# Patient Record
Sex: Female | Born: 1975 | Race: Black or African American | Hispanic: No | Marital: Married | Smoking: Current some day smoker
Health system: Southern US, Community
[De-identification: ages and names within clinical notes are randomized; demographics above are authoritative.]

## PROBLEM LIST (undated history)

## (undated) DIAGNOSIS — M549 Dorsalgia, unspecified: Secondary | ICD-10-CM

## (undated) DIAGNOSIS — M797 Fibromyalgia: Secondary | ICD-10-CM

## (undated) DIAGNOSIS — I10 Essential (primary) hypertension: Secondary | ICD-10-CM

## (undated) DIAGNOSIS — G8929 Other chronic pain: Secondary | ICD-10-CM

## (undated) DIAGNOSIS — G43909 Migraine, unspecified, not intractable, without status migrainosus: Secondary | ICD-10-CM

## (undated) HISTORY — PX: BREAST LUMPECTOMY: SHX2

## (undated) HISTORY — PX: CRYOABLATION: SHX1415

## (undated) HISTORY — PX: TUBAL LIGATION: SHX77

---

## 2009-06-16 ENCOUNTER — Emergency Department (HOSPITAL_COMMUNITY): Admission: EM | Admit: 2009-06-16 | Discharge: 2009-06-16 | Payer: Self-pay | Admitting: Emergency Medicine

## 2010-01-27 ENCOUNTER — Emergency Department (HOSPITAL_COMMUNITY): Admission: EM | Admit: 2010-01-27 | Discharge: 2010-01-27 | Payer: Self-pay | Admitting: Emergency Medicine

## 2010-05-01 ENCOUNTER — Emergency Department (HOSPITAL_COMMUNITY): Admission: EM | Admit: 2010-05-01 | Discharge: 2010-05-01 | Payer: Self-pay | Admitting: Emergency Medicine

## 2010-11-05 ENCOUNTER — Encounter: Payer: Self-pay | Admitting: Rheumatology

## 2010-12-03 ENCOUNTER — Encounter: Payer: Self-pay | Admitting: Rheumatology

## 2010-12-25 ENCOUNTER — Other Ambulatory Visit (HOSPITAL_COMMUNITY): Payer: Self-pay | Admitting: Family Medicine

## 2010-12-25 DIAGNOSIS — R29898 Other symptoms and signs involving the musculoskeletal system: Secondary | ICD-10-CM

## 2010-12-25 DIAGNOSIS — M545 Low back pain: Secondary | ICD-10-CM

## 2010-12-29 ENCOUNTER — Ambulatory Visit (HOSPITAL_COMMUNITY): Admission: RE | Admit: 2010-12-29 | Payer: Medicaid Other | Source: Ambulatory Visit

## 2010-12-30 ENCOUNTER — Ambulatory Visit (HOSPITAL_COMMUNITY)
Admission: RE | Admit: 2010-12-30 | Discharge: 2010-12-30 | Disposition: A | Discharge status: Home / Self Care | Payer: Medicaid Other | Source: Ambulatory Visit | Attending: Family Medicine | Admitting: Family Medicine

## 2010-12-30 DIAGNOSIS — M545 Low back pain: Secondary | ICD-10-CM

## 2010-12-30 DIAGNOSIS — R29898 Other symptoms and signs involving the musculoskeletal system: Secondary | ICD-10-CM

## 2010-12-30 DIAGNOSIS — M5380 Other specified dorsopathies, site unspecified: Secondary | ICD-10-CM | POA: Insufficient documentation

## 2011-01-18 LAB — URINALYSIS, ROUTINE W REFLEX MICROSCOPIC
Glucose, UA: NEGATIVE mg/dL
Leukocytes, UA: NEGATIVE
Nitrite: NEGATIVE
Specific Gravity, Urine: 1.03 (ref 1.005–1.030)
Urobilinogen, UA: 0.2 mg/dL (ref 0.0–1.0)
pH: 5.5 (ref 5.0–8.0)

## 2011-01-18 LAB — DIFFERENTIAL
Basophils Absolute: 0.1 10*3/uL (ref 0.0–0.1)
Basophils Relative: 0 % (ref 0–1)
Lymphocytes Relative: 19 % (ref 12–46)
Lymphs Abs: 3.4 10*3/uL (ref 0.7–4.0)
Monocytes Absolute: 0.9 10*3/uL (ref 0.1–1.0)
Neutro Abs: 12.8 10*3/uL — ABNORMAL HIGH (ref 1.7–7.7)
Neutrophils Relative %: 73 % (ref 43–77)

## 2011-01-18 LAB — COMPREHENSIVE METABOLIC PANEL
AST: 17 U/L (ref 0–37)
CO2: 25 mEq/L (ref 19–32)
Calcium: 9 mg/dL (ref 8.4–10.5)
Creatinine, Ser: 0.78 mg/dL (ref 0.4–1.2)
GFR calc Af Amer: >60 mL/min (ref >60)
Sodium: 140 mEq/L (ref 135–145)

## 2011-01-18 LAB — CBC
Hemoglobin: 13.1 g/dL (ref 12.0–15.0)
MCH: 24 pg — ABNORMAL LOW (ref 26.0–34.0)
MCHC: 32.3 g/dL (ref 30.0–36.0)
MCV: 74.4 fL — ABNORMAL LOW (ref 78.0–100.0)
Platelets: 310 10*3/uL (ref 150–400)
RBC: 5.46 MIL/uL — ABNORMAL HIGH (ref 3.87–5.11)

## 2011-01-18 LAB — POCT PREGNANCY, URINE: Preg Test, Ur: NEGATIVE

## 2011-01-26 LAB — PREGNANCY, URINE: Preg Test, Ur: NEGATIVE

## 2011-01-26 LAB — DIFFERENTIAL
Basophils Absolute: 0 10*3/uL (ref 0.0–0.1)
Eosinophils Relative: 1 % (ref 0–5)
Lymphocytes Relative: 10 % — ABNORMAL LOW (ref 12–46)
Lymphs Abs: 2 10*3/uL (ref 0.7–4.0)
Monocytes Absolute: 1.5 10*3/uL — ABNORMAL HIGH (ref 0.1–1.0)
Monocytes Relative: 7 % (ref 3–12)
Neutrophils Relative %: 82 % — ABNORMAL HIGH (ref 43–77)

## 2011-01-26 LAB — WET PREP, GENITAL: Yeast Wet Prep HPF POC: NONE SEEN

## 2011-01-26 LAB — CBC
HCT: 39.6 % (ref 36.0–46.0)
Hemoglobin: 12.9 g/dL (ref 12.0–15.0)
MCHC: 32.6 g/dL (ref 30.0–36.0)
MCV: 73.3 fL — ABNORMAL LOW (ref 78.0–100.0)
RDW: 15.7 % — ABNORMAL HIGH (ref 11.5–15.5)
WBC: 21.3 10*3/uL — ABNORMAL HIGH (ref 4.0–10.5)

## 2011-01-26 LAB — BASIC METABOLIC PANEL
BUN: 5 mg/dL — ABNORMAL LOW (ref 6–23)
Calcium: 8.6 mg/dL (ref 8.4–10.5)
GFR calc Af Amer: >60 mL/min (ref >60)
GFR calc non Af Amer: >60 mL/min (ref >60)

## 2011-01-26 LAB — URINALYSIS, ROUTINE W REFLEX MICROSCOPIC
Glucose, UA: NEGATIVE mg/dL
Protein, ur: 30 mg/dL — AB
Specific Gravity, Urine: 1.03 (ref 1.005–1.030)
pH: 5.5 (ref 5.0–8.0)

## 2011-01-26 LAB — URINE MICROSCOPIC-ADD ON

## 2011-02-08 LAB — URINALYSIS, ROUTINE W REFLEX MICROSCOPIC
Hgb urine dipstick: NEGATIVE
Nitrite: NEGATIVE
Specific Gravity, Urine: 1.029 (ref 1.005–1.030)
Urobilinogen, UA: >8 mg/dL — ABNORMAL HIGH (ref 0.0–1.0)
pH: 6 (ref 5.0–8.0)

## 2011-02-08 LAB — URINE CULTURE: Colony Count: 60000

## 2011-02-08 LAB — GC/CHLAMYDIA PROBE AMP, GENITAL
Chlamydia, DNA Probe: NEGATIVE
GC Probe Amp, Genital: NEGATIVE

## 2011-02-08 LAB — POCT PREGNANCY, URINE: Preg Test, Ur: NEGATIVE

## 2011-02-08 LAB — URINE MICROSCOPIC-ADD ON

## 2011-04-29 ENCOUNTER — Emergency Department (HOSPITAL_COMMUNITY)
Admission: EM | Admit: 2011-04-29 | Discharge: 2011-04-29 | Disposition: A | Discharge status: Home / Self Care | Payer: Medicaid Other | Attending: Emergency Medicine | Admitting: Emergency Medicine

## 2011-04-29 DIAGNOSIS — IMO0001 Reserved for inherently not codable concepts without codable children: Secondary | ICD-10-CM | POA: Insufficient documentation

## 2011-04-29 DIAGNOSIS — R112 Nausea with vomiting, unspecified: Secondary | ICD-10-CM | POA: Insufficient documentation

## 2011-04-29 DIAGNOSIS — M549 Dorsalgia, unspecified: Secondary | ICD-10-CM | POA: Insufficient documentation

## 2011-04-29 DIAGNOSIS — M1990 Unspecified osteoarthritis, unspecified site: Secondary | ICD-10-CM | POA: Insufficient documentation

## 2011-04-29 DIAGNOSIS — J45909 Unspecified asthma, uncomplicated: Secondary | ICD-10-CM | POA: Insufficient documentation

## 2011-04-29 DIAGNOSIS — R51 Headache: Secondary | ICD-10-CM | POA: Insufficient documentation

## 2011-04-29 LAB — URINALYSIS, ROUTINE W REFLEX MICROSCOPIC
Leukocytes, UA: NEGATIVE
Nitrite: NEGATIVE
Protein, ur: NEGATIVE mg/dL
Specific Gravity, Urine: 1.015 (ref 1.005–1.030)
Urobilinogen, UA: 0.2 mg/dL (ref 0.0–1.0)

## 2011-04-29 LAB — URINE MICROSCOPIC-ADD ON

## 2011-05-15 ENCOUNTER — Emergency Department (HOSPITAL_COMMUNITY): Payer: Medicaid Other

## 2011-05-15 ENCOUNTER — Emergency Department (HOSPITAL_COMMUNITY)
Admission: EM | Admit: 2011-05-15 | Discharge: 2011-05-15 | Disposition: A | Discharge status: Home / Self Care | Payer: Medicaid Other | Attending: Emergency Medicine | Admitting: Emergency Medicine

## 2011-05-15 ENCOUNTER — Encounter: Payer: Self-pay | Admitting: Emergency Medicine

## 2011-05-15 ENCOUNTER — Other Ambulatory Visit: Payer: Self-pay

## 2011-05-15 DIAGNOSIS — G43909 Migraine, unspecified, not intractable, without status migrainosus: Secondary | ICD-10-CM | POA: Insufficient documentation

## 2011-05-15 DIAGNOSIS — R1011 Right upper quadrant pain: Secondary | ICD-10-CM | POA: Insufficient documentation

## 2011-05-15 DIAGNOSIS — IMO0001 Reserved for inherently not codable concepts without codable children: Secondary | ICD-10-CM | POA: Insufficient documentation

## 2011-05-15 DIAGNOSIS — D72829 Elevated white blood cell count, unspecified: Secondary | ICD-10-CM | POA: Insufficient documentation

## 2011-05-15 DIAGNOSIS — F17200 Nicotine dependence, unspecified, uncomplicated: Secondary | ICD-10-CM | POA: Insufficient documentation

## 2011-05-15 HISTORY — DX: Fibromyalgia: M79.7

## 2011-05-15 HISTORY — DX: Dorsalgia, unspecified: M54.9

## 2011-05-15 HISTORY — DX: Migraine, unspecified, not intractable, without status migrainosus: G43.909

## 2011-05-15 HISTORY — DX: Other chronic pain: G89.29

## 2011-05-15 LAB — URINALYSIS, ROUTINE W REFLEX MICROSCOPIC
Bilirubin Urine: NEGATIVE
Glucose, UA: NEGATIVE mg/dL
Ketones, ur: NEGATIVE mg/dL
Leukocytes, UA: NEGATIVE
Nitrite: NEGATIVE
Specific Gravity, Urine: 1.015 (ref 1.005–1.030)
pH: 8.5 — ABNORMAL HIGH (ref 5.0–8.0)

## 2011-05-15 LAB — COMPREHENSIVE METABOLIC PANEL
ALT: 23 U/L (ref 0–35)
Albumin: 3.3 g/dL — ABNORMAL LOW (ref 3.5–5.2)
Alkaline Phosphatase: 134 U/L — ABNORMAL HIGH (ref 39–117)
Calcium: 9.6 mg/dL (ref 8.4–10.5)
GFR calc Af Amer: >60 mL/min (ref >60)
Potassium: 4 mEq/L (ref 3.5–5.1)
Sodium: 136 mEq/L (ref 135–145)
Total Protein: 8.1 g/dL (ref 6.0–8.3)

## 2011-05-15 LAB — CBC
MCH: 23.4 pg — ABNORMAL LOW (ref 26.0–34.0)
MCHC: 32.5 g/dL (ref 30.0–36.0)
Platelets: 475 10*3/uL — ABNORMAL HIGH (ref 150–400)
RDW: 15.2 % (ref 11.5–15.5)

## 2011-05-15 MED ORDER — SODIUM CHLORIDE 0.9 % IV SOLN
Freq: Once | INTRAVENOUS | Status: AC
  Administered 2011-05-15: 17:00:00 via INTRAVENOUS

## 2011-05-15 MED ORDER — IOHEXOL 300 MG/ML  SOLN
100.0000 mL | Freq: Once | INTRAMUSCULAR | Status: AC | PRN
  Administered 2011-05-15: 100 mL via INTRAVENOUS

## 2011-05-15 MED ORDER — ONDANSETRON HCL 4 MG/2ML IJ SOLN
4.0000 mg | Freq: Once | INTRAMUSCULAR | Status: AC
  Administered 2011-05-15: 4 mg via INTRAVENOUS
  Filled 2011-05-15: qty 2

## 2011-05-15 MED ORDER — MORPHINE SULFATE 4 MG/ML IJ SOLN
4.0000 mg | Freq: Once | INTRAMUSCULAR | Status: AC
  Administered 2011-05-15: 4 mg via INTRAVENOUS
  Filled 2011-05-15: qty 1

## 2011-05-15 MED ORDER — OXYCODONE-ACETAMINOPHEN 5-325 MG PO TABS: 2.0000 | ORAL_TABLET | ORAL | Status: AC | PRN

## 2011-05-15 MED ORDER — OXYCODONE-ACETAMINOPHEN 5-325 MG PO TABS
1.0000 | ORAL_TABLET | Freq: Once | ORAL | Status: AC
  Administered 2011-05-15: 1 via ORAL
  Filled 2011-05-15: qty 1

## 2011-05-15 NOTE — ED Notes (Signed)
Pt back from ct

## 2011-05-15 NOTE — ED Notes (Signed)
Pt c/o sharp, shooting pain that started to r side of chest that radiates down r side and in r leg posterior. Pt tearful. Deep breathing and any movement makes the pain worse. "a little" sob.

## 2011-05-15 NOTE — ED Provider Notes (Signed)
History     Chief Complaint  Patient presents with  . Chest Pain   HPI Comments: She woke up with right sided pain radiating from her right upper chest to her right leg.   She says the pain comes and goes and lasts a few minutes.  It is easing off now.  She denies trauma, fever, chills, n/v/sob/uti sxs.  She denies cough.  The pain does not change with movement or food.  She denies similar sxs in past.  She has had a BTL.  She denies recent surgery or travel.  She smokes.    The history is provided by the patient.    Past Medical History  Diagnosis Date  . Fibromyalgia   . Chronic back pain   . Migraines     Past Surgical History  Procedure Date  . Tubal ligation   . Breast lumpectomy     History reviewed. No pertinent family history.  History  Substance Use Topics  . Smoking status: Current Everyday Smoker    Types: Cigarettes  . Smokeless tobacco: Not on file  . Alcohol Use: No    OB History    Grav Para Term Preterm Abortions TAB SAB Ect Mult Living                  Review of Systems  Constitutional: Negative for fever, chills, diaphoresis and appetite change.  HENT: Negative for facial swelling and neck pain.   Eyes: Negative for redness.  Respiratory: Negative for cough, chest tightness and shortness of breath.   Cardiovascular: Positive for chest pain. Negative for palpitations.  Genitourinary: Positive for flank pain. Negative for dysuria, urgency, frequency, hematuria, vaginal bleeding, vaginal discharge, difficulty urinating and pelvic pain.  Musculoskeletal: Negative for back pain.  Neurological: Negative for headaches.  Hematological: Does not bruise/bleed easily.  Psychiatric/Behavioral: Negative for confusion.    Physical Exam  BP 148/99  Pulse 109  Temp(Src) 98.4 F (36.9 C) (Oral)  SpO2 100%  LMP 05/14/2010  Physical Exam  Constitutional: She is oriented to person, place, and time. She appears well-developed and well-nourished.  HENT:    Head: Normocephalic.  Eyes: Pupils are equal, round, and reactive to light.  Neck: Normal range of motion. Neck supple. No thyromegaly present.  Cardiovascular: Tachycardia present.   No murmur heard. Pulmonary/Chest: No respiratory distress. She has no wheezes. She has no rales.  Abdominal: She exhibits no ascites and no mass. There is tenderness in the right upper quadrant. There is no rigidity, no rebound, no guarding, no tenderness at McBurney's point and negative Murphy's sign.  Musculoskeletal: Normal range of motion. She exhibits no edema and no tenderness.  Neurological: She is alert and oriented to person, place, and time.  Skin: Skin is warm and dry.  Psychiatric: She has a normal mood and affect. Her behavior is normal.    ED Course  Procedures  ED ECG REPORT   Date: 05/15/2011  EKG Time: 15:45  Rate: 114  Rhythm: sinus tachycardia,  sinus tachycardia  Axis: normal  Intervals:none  ST&T Change: normal  Narrative Interpretation: sinus tach, o/w normal           MDM   Pain improved. Explained test results and plan for discharge. Pt and husband agree with plan.          Nicholes Stairs, MD 05/15/11 2049

## 2011-07-14 ENCOUNTER — Other Ambulatory Visit (HOSPITAL_COMMUNITY): Payer: Self-pay | Admitting: Family Medicine

## 2011-07-14 DIAGNOSIS — R51 Headache: Secondary | ICD-10-CM

## 2011-07-16 ENCOUNTER — Ambulatory Visit (HOSPITAL_COMMUNITY): Payer: Medicaid Other | Attending: Family Medicine

## 2011-07-23 ENCOUNTER — Ambulatory Visit (HOSPITAL_COMMUNITY)
Admission: RE | Admit: 2011-07-23 | Discharge: 2011-07-23 | Disposition: A | Discharge status: Home / Self Care | Payer: Medicaid Other | Source: Ambulatory Visit | Attending: Family Medicine | Admitting: Family Medicine

## 2011-07-23 DIAGNOSIS — R51 Headache: Secondary | ICD-10-CM | POA: Insufficient documentation

## 2011-08-03 ENCOUNTER — Other Ambulatory Visit: Payer: Self-pay

## 2011-08-03 ENCOUNTER — Emergency Department (HOSPITAL_COMMUNITY)
Admission: EM | Admit: 2011-08-03 | Discharge: 2011-08-04 | Disposition: A | Discharge status: Home / Self Care | Payer: Medicaid Other | Attending: Emergency Medicine | Admitting: Emergency Medicine

## 2011-08-03 DIAGNOSIS — J45909 Unspecified asthma, uncomplicated: Secondary | ICD-10-CM | POA: Insufficient documentation

## 2011-08-03 DIAGNOSIS — I10 Essential (primary) hypertension: Secondary | ICD-10-CM | POA: Insufficient documentation

## 2011-08-03 DIAGNOSIS — IMO0001 Reserved for inherently not codable concepts without codable children: Secondary | ICD-10-CM | POA: Insufficient documentation

## 2011-08-03 DIAGNOSIS — G43909 Migraine, unspecified, not intractable, without status migrainosus: Secondary | ICD-10-CM | POA: Insufficient documentation

## 2011-08-03 DIAGNOSIS — I498 Other specified cardiac arrhythmias: Secondary | ICD-10-CM | POA: Insufficient documentation

## 2011-08-03 DIAGNOSIS — F17200 Nicotine dependence, unspecified, uncomplicated: Secondary | ICD-10-CM | POA: Insufficient documentation

## 2011-08-03 DIAGNOSIS — R0789 Other chest pain: Secondary | ICD-10-CM

## 2011-08-03 DIAGNOSIS — R071 Chest pain on breathing: Secondary | ICD-10-CM | POA: Insufficient documentation

## 2011-08-03 HISTORY — DX: Essential (primary) hypertension: I10

## 2011-08-03 NOTE — ED Notes (Signed)
Left side cp that started this am, sob at times, denies any n/v, also migraine for several weeks

## 2011-08-04 ENCOUNTER — Encounter: Payer: Self-pay | Admitting: Emergency Medicine

## 2011-08-04 MED ORDER — ONDANSETRON HCL 4 MG PO TABS
4.0000 mg | ORAL_TABLET | Freq: Once | ORAL | Status: AC
  Administered 2011-08-04: 4 mg via ORAL
  Filled 2011-08-04: qty 1

## 2011-08-04 MED ORDER — DIPHENHYDRAMINE HCL 50 MG/ML IJ SOLN
25.0000 mg | Freq: Once | INTRAMUSCULAR | Status: AC
  Administered 2011-08-04: 25 mg via INTRAMUSCULAR
  Filled 2011-08-04: qty 1

## 2011-08-04 MED ORDER — HYDROMORPHONE HCL 1 MG/ML IJ SOLN
1.0000 mg | Freq: Once | INTRAMUSCULAR | Status: AC
  Administered 2011-08-04: 1 mg via INTRAMUSCULAR
  Filled 2011-08-04: qty 1

## 2011-08-04 MED ORDER — METOCLOPRAMIDE HCL 5 MG/ML IJ SOLN
10.0000 mg | Freq: Once | INTRAMUSCULAR | Status: AC
  Administered 2011-08-04: 10 mg via INTRAMUSCULAR
  Filled 2011-08-04: qty 2

## 2011-08-04 NOTE — ED Provider Notes (Signed)
History     CSN: [id number] Arrival date & time: 08/03/2011 10:08 PM  Chief Complaint  Patient presents with  . Chest Pain    startd today  . Migraine    for weeks    (Consider location/radiation/quality/duration/timing/severity/associated sxs/prior treatment) HPI Comments: Seen 2307.Patient here with chest pain that started today that she thinks was brought on by a headache she has had for two months. Chest pain was on the left side, sharp and stabbing, worse with deep breathing. It has been intermittent since it began this morning.She has had a headache for 2 months that she has been unable to get rid of. Headache occurs daily. Sometimes she wakes in the morning with the headache. She has not been evaluated by neurology. She has seen her PCP who gave her a Rx for a medication she has yet to get filled.At the present time her headache is worse than her chest pain. Headache is not associated with vision changes,hearing changes, stiff neck, siddiculty swallowing or speaking.   Patient is a 35 y.o. female presenting with chest pain. The history is provided by the patient.  Chest Pain The chest pain began 6 - 12 hours ago. Chest pain occurs frequently. The chest pain is unchanged. At its most intense, the pain is at 7/10. The pain is currently at 3/10. The severity of the pain is moderate. The quality of the pain is described as sharp and stabbing. The pain does not radiate. Chest pain is worsened by deep breathing. Pertinent negatives for primary symptoms include no fever, no cough, no wheezing, no palpitations, no nausea, no vomiting and no dizziness.  Pertinent negatives for associated symptoms include no numbness and no weakness. She tried nothing for the symptoms. Risk factors include no known risk factors.     Past Medical History  Diagnosis Date  . Fibromyalgia   . Hypertension   . Asthma     Past Surgical History  Procedure Date  . Tubal ligation   . Cryoablation     History  reviewed. No pertinent family history.  History  Substance Use Topics  . Smoking status: Current Everyday Smoker    Types: Cigarettes  . Smokeless tobacco: Not on file  . Alcohol Use: No    OB History    Grav Para Term Preterm Abortions TAB SAB Ect Mult Living                  Review of Systems  Constitutional: Negative for fever.  Respiratory: Negative for cough and wheezing.   Cardiovascular: Positive for chest pain. Negative for palpitations and leg swelling.  Gastrointestinal: Negative for nausea and vomiting.  Neurological: Positive for headaches. Negative for dizziness, speech difficulty, weakness and numbness.  All other systems reviewed and are negative.    Allergies  Aspirin and Ceclor  Home Medications   Current Outpatient Rx  Name Route Sig Dispense Refill  . DICLOFENAC SODIUM 75 MG PO TBEC Oral Take 75 mg by mouth 2 (two) times daily.      Marland Kitchen GABAPENTIN PO Oral Take 1 capsule by mouth 2 (two) times daily.      Marland Kitchen FLUOXETINE HCL 20 MG PO CAPS Oral Take 20 mg by mouth daily.      Marland Kitchen TIZANIDINE HCL 4 MG PO TABS Oral Take 4 mg by mouth 2 (two) times daily.        BP 132/81  Pulse 96  Temp(Src) 98.4 F (36.9 C) (Oral)  Resp 16  Ht 5' 3.5" (  1.613 m)  Wt 194 lb (87.998 kg)  BMI 33.83 kg/m2  SpO2 99%  LMP 08/03/2011  Physical Exam  Nursing note and vitals reviewed. Constitutional: She is oriented to person, place, and time. She appears well-developed and well-nourished.       Anxious, hyperventilating, tearful  HENT:  Head: Normocephalic and atraumatic.  Right Ear: External ear normal.  Left Ear: External ear normal.  Nose: Nose normal.  Mouth/Throat: Oropharynx is clear and moist.  Eyes: Conjunctivae and EOM are normal. Pupils are equal, round, and reactive to light.  Neck: Normal range of motion. Neck supple.  Cardiovascular: Normal rate, normal heart sounds and intact distal pulses.   Pulmonary/Chest: Breath sounds normal. No respiratory distress.  She has no wheezes. She has no rales. She exhibits no tenderness.  Abdominal: Soft. Bowel sounds are normal.  Musculoskeletal: Normal range of motion.  Neurological: She is alert and oriented to person, place, and time. She has normal reflexes. No cranial nerve deficit. Coordination normal.  Skin: Skin is warm and dry.  Psychiatric:       Anxious, tearful    ED Course  Procedures (including critical care time)  Labs Reviewed - No data to display No results found.   1. Migraine   2. Chest wall pain     Date: 08/04/2011 2202  Rate: 105  Rhythm: sinus tachycardia  QRS Axis: normal  Intervals: normal  ST/T Wave abnormalities: normal  Conduction Disutrbances:none  Narrative Interpretation:   Old EKG Reviewed: none available    MDM  Patient with c/o sharp stabbing chest pain and a longstanding headache. EKG was normal. Focus seemed to be on headache. Patient anxious and tearful on arrival. Given analgesic, antiemetic and benadryl with headache relief. Patient took PO fluids. Ambulated to the bathroom without assistance.Pt feels improved after observation and/or treatment in ED.Pt stable in ED with no significant deterioration in condition.The patient appears reasonably screened and/or stabilized for discharge and I doubt any other medical condition or other [facility] requiring further screening, evaluation, or treatment in the ED at this time prior to discharge. MDM Reviewed: nursing note and vitals           Nicoletta Dress. Colon Branch, MD 08/30/11 1538

## 2012-07-21 IMAGING — CT CT HEAD W/O CM
1 series · 16 of 30 positions shown, 20 images · non-contrast
Comparison: None

CLINICAL DATA: Migraine headaches, fibromyalgia, leukocytosis

CT HEAD WITHOUT CONTRAST
TECHNIQUE: Contiguous axial images were obtained from the base of
the skull through the vertex without contrast.

[Series 2: headseq 4.8 h37s · axial · 0.43mm/px · z∈[+115,+267]mm · 16 of 36 slices shown, 20 images]
[im 2/36  brain]
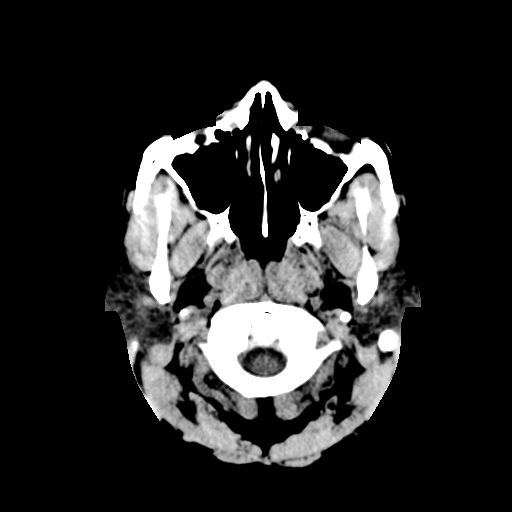
[im 2/36  bone]
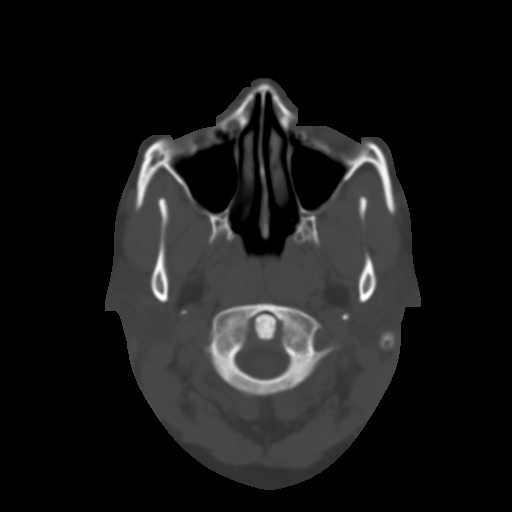
[im 4/36  brain]
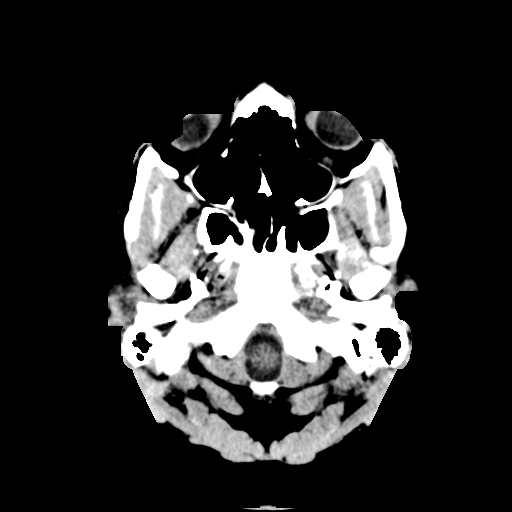
[im 7/36  brain]
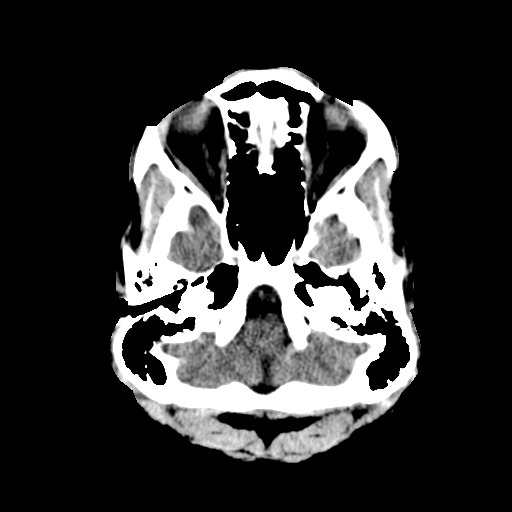
[im 9/36  brain]
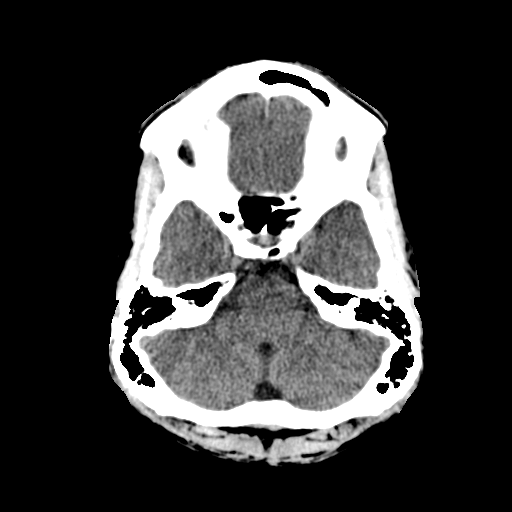
[im 10/36  brain]
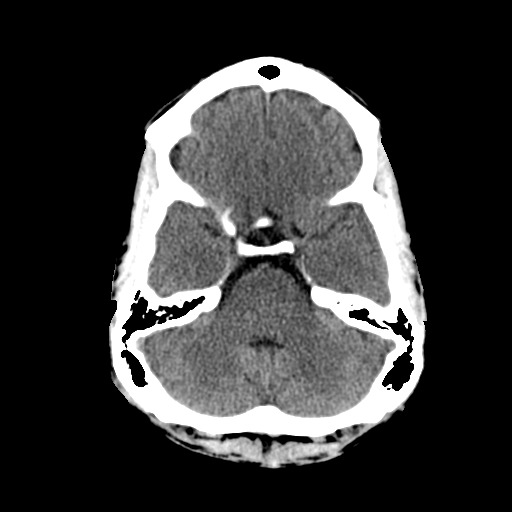
[im 10/36  bone]
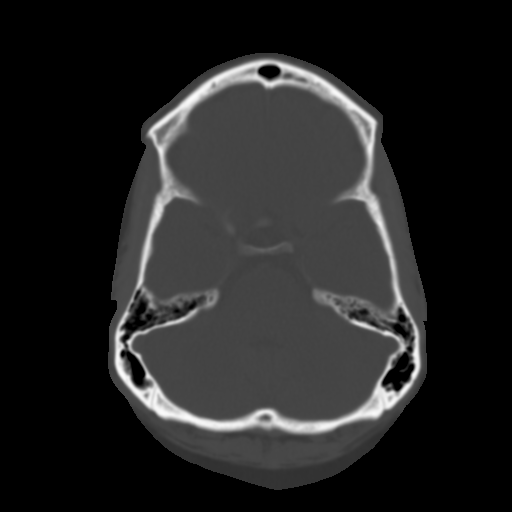
[im 13/36  brain]
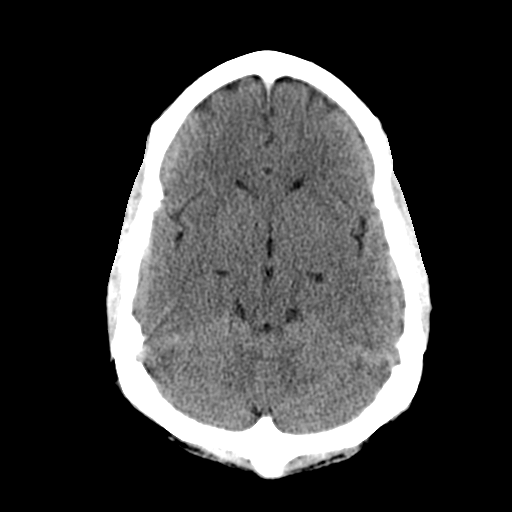
[im 15/36  brain]
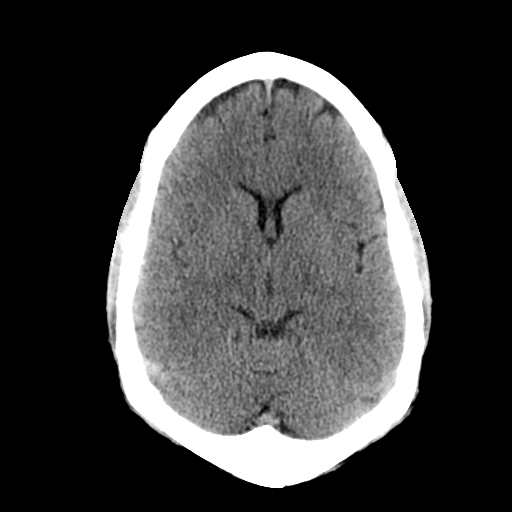
[im 17/36  brain]
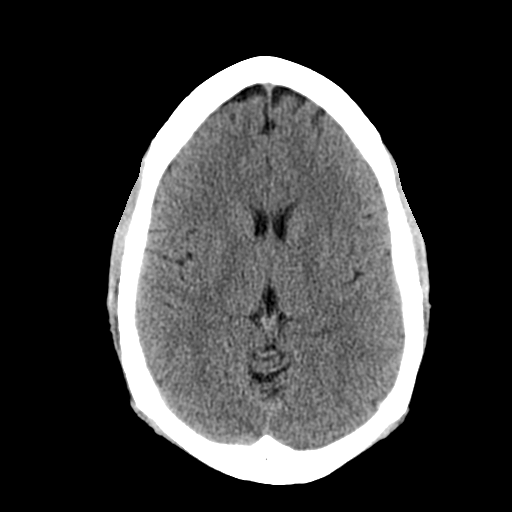
[im 19/36  brain]
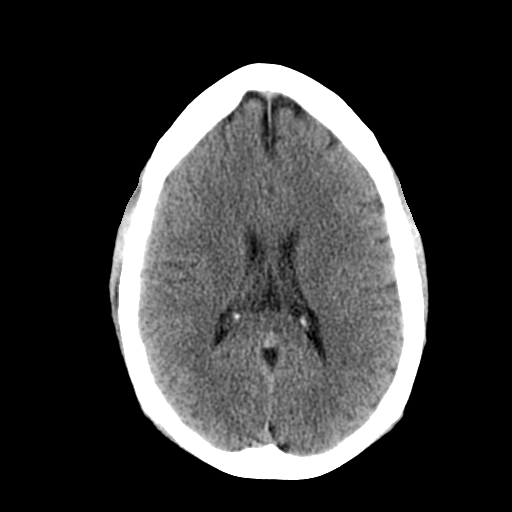
[im 19/36  bone]
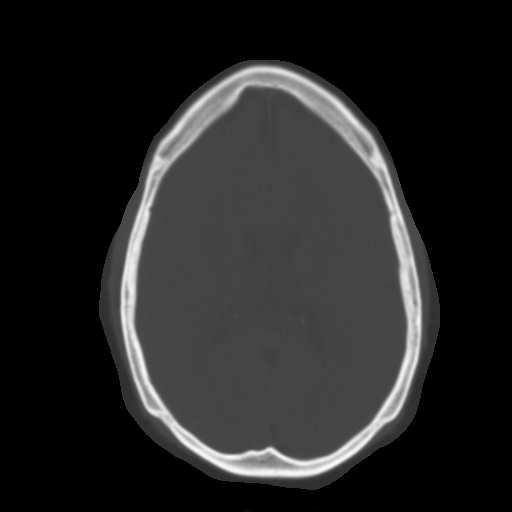
[im 21/36  brain]
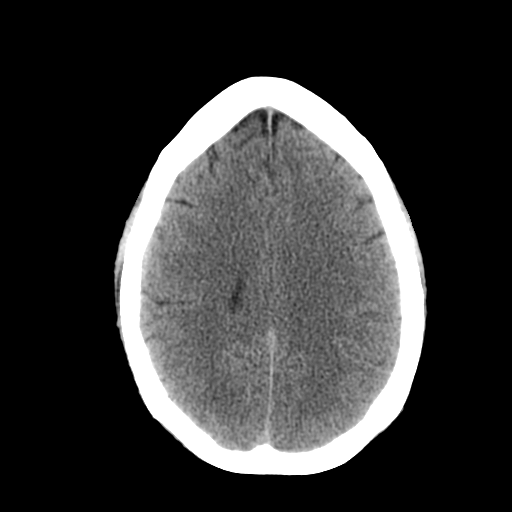
[im 23/36  brain]
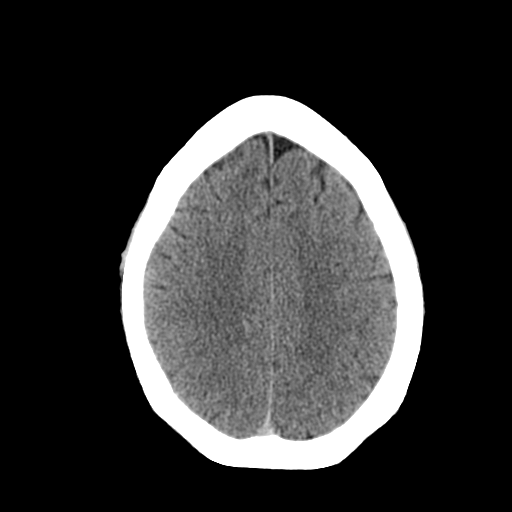
[im 26/36  brain]
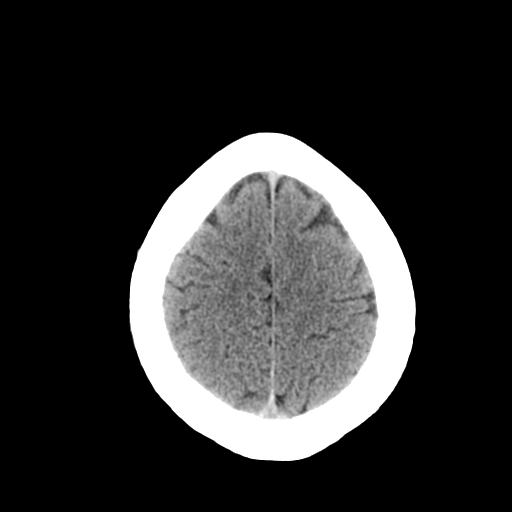
[im 27/36  brain]
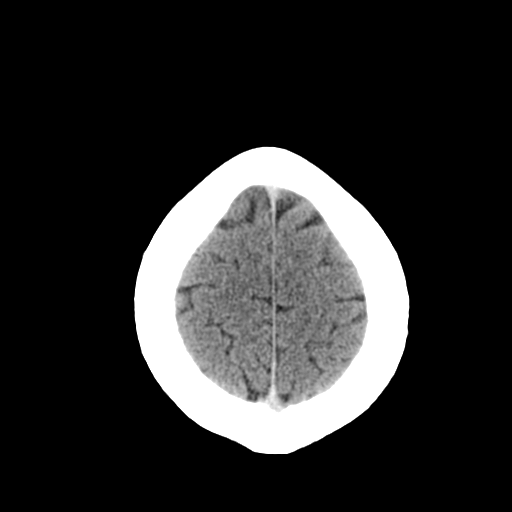
[im 27/36  bone]
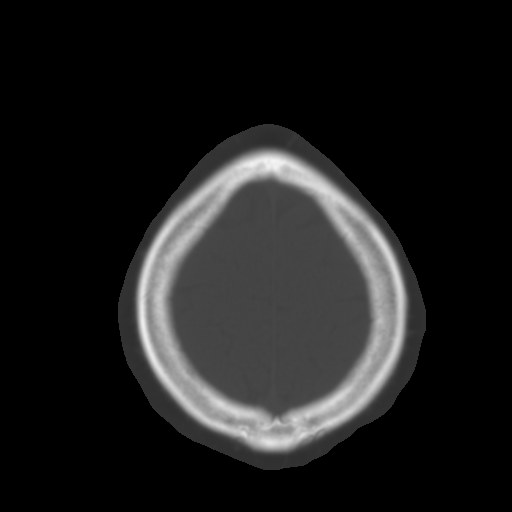
[im 29/36  brain]
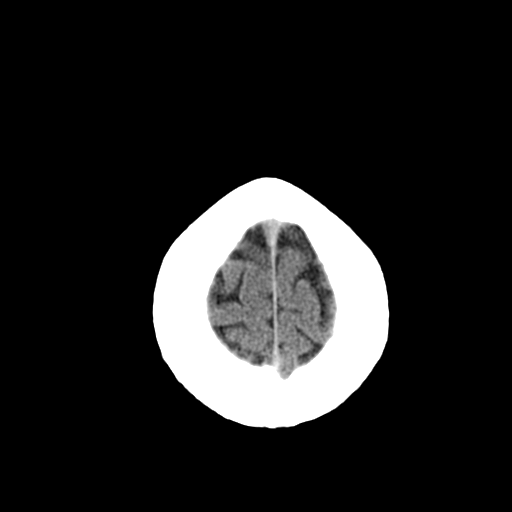
[im 32/36  brain]
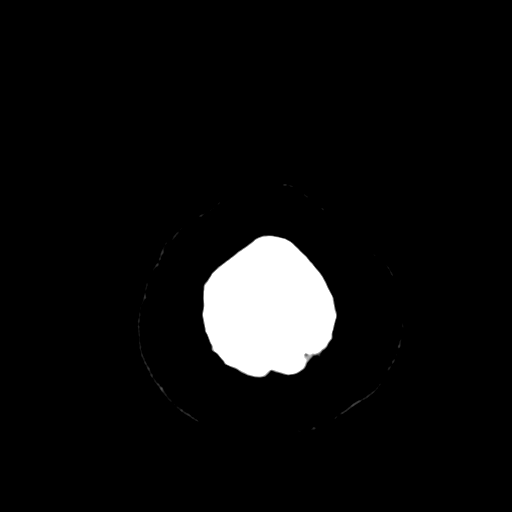
[im 34/36  brain]
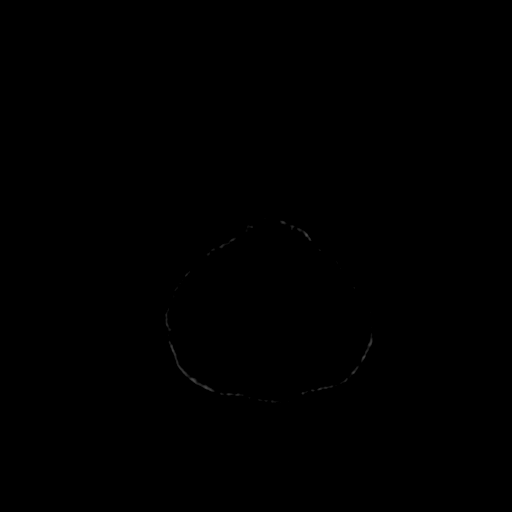

[16 of 30 positions shown; findings below may reference images not displayed]

FINDINGS: Normal ventricular morphology.
No midline shift or mass effect.
Normal appearance of brain parenchyma.
No intracranial hemorrhage, mass lesion, or acute infarction.
Visualized paranasal sinuses and mastoid air cells clear.
Bones unremarkable.
IMPRESSION: Normal exam.

## 2012-12-27 ENCOUNTER — Encounter (HOSPITAL_COMMUNITY): Payer: Self-pay

## 2012-12-27 ENCOUNTER — Emergency Department (HOSPITAL_COMMUNITY)
Admission: EM | Admit: 2012-12-27 | Discharge: 2012-12-27 | Disposition: A | Discharge status: Home / Self Care | Payer: Medicaid Other | Attending: Emergency Medicine | Admitting: Emergency Medicine

## 2012-12-27 DIAGNOSIS — I10 Essential (primary) hypertension: Secondary | ICD-10-CM | POA: Insufficient documentation

## 2012-12-27 DIAGNOSIS — Z79899 Other long term (current) drug therapy: Secondary | ICD-10-CM | POA: Insufficient documentation

## 2012-12-27 DIAGNOSIS — K5289 Other specified noninfective gastroenteritis and colitis: Secondary | ICD-10-CM | POA: Insufficient documentation

## 2012-12-27 DIAGNOSIS — G8929 Other chronic pain: Secondary | ICD-10-CM | POA: Insufficient documentation

## 2012-12-27 DIAGNOSIS — F17200 Nicotine dependence, unspecified, uncomplicated: Secondary | ICD-10-CM | POA: Insufficient documentation

## 2012-12-27 DIAGNOSIS — IMO0001 Reserved for inherently not codable concepts without codable children: Secondary | ICD-10-CM | POA: Insufficient documentation

## 2012-12-27 DIAGNOSIS — J45909 Unspecified asthma, uncomplicated: Secondary | ICD-10-CM | POA: Insufficient documentation

## 2012-12-27 DIAGNOSIS — K529 Noninfective gastroenteritis and colitis, unspecified: Secondary | ICD-10-CM

## 2012-12-27 DIAGNOSIS — G43909 Migraine, unspecified, not intractable, without status migrainosus: Secondary | ICD-10-CM | POA: Insufficient documentation

## 2012-12-27 DIAGNOSIS — R1084 Generalized abdominal pain: Secondary | ICD-10-CM | POA: Insufficient documentation

## 2012-12-27 DIAGNOSIS — Z9851 Tubal ligation status: Secondary | ICD-10-CM | POA: Insufficient documentation

## 2012-12-27 DIAGNOSIS — R197 Diarrhea, unspecified: Secondary | ICD-10-CM | POA: Insufficient documentation

## 2012-12-27 MED ORDER — PROMETHAZINE HCL 25 MG PO TABS: 25.0000 mg | ORAL_TABLET | Freq: Four times a day (QID) | ORAL | Status: DC | PRN

## 2012-12-27 MED ORDER — DIPHENOXYLATE-ATROPINE 2.5-0.025 MG PO TABS: 1.0000 | ORAL_TABLET | Freq: Four times a day (QID) | ORAL | Status: DC | PRN

## 2012-12-27 MED ORDER — SODIUM CHLORIDE 0.9 % IV BOLUS (SEPSIS)
1000.0000 mL | Freq: Once | INTRAVENOUS | Status: AC
  Administered 2012-12-27: 1000 mL via INTRAVENOUS

## 2012-12-27 MED ORDER — PANTOPRAZOLE SODIUM 40 MG IV SOLR
40.0000 mg | Freq: Once | INTRAVENOUS | Status: AC
  Administered 2012-12-27: 40 mg via INTRAVENOUS
  Filled 2012-12-27: qty 40

## 2012-12-27 MED ORDER — ONDANSETRON HCL 4 MG/2ML IJ SOLN
4.0000 mg | Freq: Once | INTRAMUSCULAR | Status: AC
  Administered 2012-12-27: 4 mg via INTRAVENOUS
  Filled 2012-12-27: qty 2

## 2012-12-27 MED ORDER — FENTANYL CITRATE 0.05 MG/ML IJ SOLN
50.0000 ug | Freq: Once | INTRAMUSCULAR | Status: AC
  Administered 2012-12-27: 50 ug via INTRAVENOUS
  Filled 2012-12-27: qty 2

## 2012-12-27 NOTE — ED Notes (Signed)
Pt c/o n/v/d and abd pain since  4 am this morning.  Reports wasn't feeling well yesterday but didn't start vomiting until this morning.

## 2012-12-27 NOTE — Discharge Instructions (Signed)
Clear Liquid Diet You may be put on a clear liquid diet:  Because you need surgery.  As the first step in eating food by mouth (oral feeding).  To replace fluid when you have watery poop (diarrhea).  As a diet before certain tests are done. HOME CARE  You may have any of the following:  Strained vegetable or fruit juice (no pulp).  Clear broth soup.  High protein gelatin.  Flavored gelatin or ices.  Frozen ice pops without milk.  Honey.  Coffee or tea.  Nutritional drinks that do not have dairy in them.  Bubbly (carbonated) drinks only if your doctor approves. Avoid:  Starches like potatoes, rice, corn, or wheat.  Vegetables (strained vegetable or tomato juice is okay).  Fruit (fruit juice is okay).  Meat.  Milk or other dairy drinks.  Any soups besides those that contain only broth.  Bread.  Certain desserts. Ask your caregiver.  Fats and oils like butter, margarine, mayonnaise, or cooking oils.  Pepper or other spices besides salt.  Supplements that have lactose or fiber in them. Supplements are things you eat or drink like vitamins, minerals, herbs, or nutrition drinks. MAKE SURE YOU:  Understand these instructions.  Will watch your condition.  Will get help right away if you are not doing well or get worse. Document Released: 10/01/2008 Document Revised: 01/11/2012 Document Reviewed: 01/16/2011 [facility] Patient Information 2013 [place], [place].  Clear liquids tonight. Medication for nausea and diarrhea. Rest.

## 2012-12-27 NOTE — ED Provider Notes (Signed)
History     This chart was scribed for Donnetta Hutching, MD, MD by Smitty Pluck, ED Scribe. The patient was seen in room APA08/APA08 and the patient's care was started at 1:18 PM.   CSN: [id number]  Arrival date & time 12/27/12  1257      Chief Complaint  Patient presents with  . Emesis  . Diarrhea  . Abdominal Pain    The history is provided by the patient. No language interpreter was used.   Melissa Lindsey is a 37 y.o. female who presents to the Emergency Department complaining of constant moderate emesis onset 4AM today. Pt reports having vomiting 10-15x times today. She states she has diffuse abdominal pain and diarrhea 4-5x today. She describes the stool as loose. Denies sick contact. She states she her BP has been 130s/100. Pt denies fever, chills, blood in stool, hematemesis, weakness, cough, SOB and any other pain. She reports hx of fibromyalgia.   PCP is at [facility] medicine  Past Medical History  Diagnosis Date  . Chronic back pain   . Migraines   . Fibromyalgia   . Hypertension   . Asthma     Past Surgical History  Procedure Laterality Date  . Breast lumpectomy    . Tubal ligation    . Cryoablation      No family history on file.  History  Substance Use Topics  . Smoking status: Current Every Day Smoker    Types: Cigarettes  . Smokeless tobacco: Not on file  . Alcohol Use: No    OB History   Grav Para Term Preterm Abortions TAB SAB Ect Mult Living                  Review of Systems 10 Systems reviewed and all are negative for acute change except as noted in the HPI.   Allergies  Aspirin; Aspirin; Ceclor; and Ceclor  Home Medications   Current Outpatient Rx  Name  Route  Sig  Dispense  Refill  . AMITRIPTYLINE HCL PO   Oral   Take by mouth.           . Cyclobenzaprine HCl (FLEXERIL PO)   Oral   Take by mouth.           . diclofenac (VOLTAREN) 75 MG EC tablet   Oral   Take 75 mg by mouth 2 (two) times daily.           .  Diclofenac Sodium (VOLTAREN PO)   Oral   Take by mouth.           Marland Kitchen FLUoxetine (PROZAC) 20 MG capsule   Oral   Take 20 mg by mouth daily.           Marland Kitchen FLUoxetine HCl (PROZAC PO)   Oral   Take by mouth.           Marland Kitchen GABAPENTIN PO   Oral   Take 1 capsule by mouth 2 (two) times daily.           Marland Kitchen GABAPENTIN, PHN, PO   Oral   Take by mouth.           . MEGESTROL ACETATE PO   Oral   Take by mouth 1 day or 1 dose.           Marland Kitchen tiZANidine (ZANAFLEX) 4 MG tablet   Oral   Take 4 mg by mouth 2 (two) times daily.           Marland Kitchen  TiZANidine HCl (ZANAFLEX PO)   Oral   Take by mouth.             BP 126/106  Pulse 125  Temp(Src) 97.3 F (36.3 C) (Oral)  Resp 22  Ht 5' 3.5" (1.613 m)  Wt 200 lb (90.719 kg)  BMI 34.87 kg/m2  SpO2 100%  Physical Exam  Nursing note and vitals reviewed. Constitutional: She is oriented to person, place, and time. She appears well-developed and well-nourished.  HENT:  Head: Normocephalic and atraumatic.  Eyes: Conjunctivae and EOM are normal. Pupils are equal, round, and reactive to light.  Neck: Normal range of motion. Neck supple.  Cardiovascular: Normal rate, regular rhythm and normal heart sounds.   Pulmonary/Chest: Effort normal and breath sounds normal.  Abdominal: Soft. Bowel sounds are normal. She exhibits no distension. There is tenderness (mild). There is no rebound and no guarding.  Musculoskeletal: Normal range of motion.  Neurological: She is alert and oriented to person, place, and time.  Skin: Skin is warm and dry.  Psychiatric: She has a normal mood and affect.    ED Course  Procedures (including critical care time) DIAGNOSTIC STUDIES: Oxygen Saturation is 100% on room air, normal by my interpretation.    COORDINATION OF CARE: 1:20 PM Discussed ED treatment (IV fluids, nausea medication, protonix) with pt and pt agrees.  1:31 PM Ordered:  Medications  sodium chloride 0.9 % bolus 1,000 mL (not administered)   sodium chloride 0.9 % bolus 1,000 mL (not administered)  pantoprazole (PROTONIX) injection 40 mg (not administered)  ondansetron (ZOFRAN) injection 4 mg (not administered)       Labs Reviewed - No data to display No results found.   No diagnosis found.    MDM  Patient feeling better after IV hydration, Zofran, Protonix. No acute abdomen at discharge.  Discharge meds Phenergan 25 mg #15 and Lomotil #15     I personally performed the services described in this documentation, which was scribed in my presence. The recorded information has been reviewed and is accurate.    Donnetta Hutching, MD 12/27/12 [phone]

## 2013-02-09 ENCOUNTER — Emergency Department (HOSPITAL_COMMUNITY): Payer: Medicaid Other

## 2013-02-09 ENCOUNTER — Encounter (HOSPITAL_COMMUNITY): Payer: Self-pay | Admitting: Emergency Medicine

## 2013-02-09 ENCOUNTER — Emergency Department (HOSPITAL_COMMUNITY)
Admission: EM | Admit: 2013-02-09 | Discharge: 2013-02-09 | Disposition: A | Discharge status: Home / Self Care | Payer: Medicaid Other | Attending: Emergency Medicine | Admitting: Emergency Medicine

## 2013-02-09 DIAGNOSIS — IMO0001 Reserved for inherently not codable concepts without codable children: Secondary | ICD-10-CM | POA: Insufficient documentation

## 2013-02-09 DIAGNOSIS — Z9851 Tubal ligation status: Secondary | ICD-10-CM | POA: Insufficient documentation

## 2013-02-09 DIAGNOSIS — J45909 Unspecified asthma, uncomplicated: Secondary | ICD-10-CM | POA: Insufficient documentation

## 2013-02-09 DIAGNOSIS — N201 Calculus of ureter: Secondary | ICD-10-CM

## 2013-02-09 DIAGNOSIS — R300 Dysuria: Secondary | ICD-10-CM | POA: Insufficient documentation

## 2013-02-09 DIAGNOSIS — G43909 Migraine, unspecified, not intractable, without status migrainosus: Secondary | ICD-10-CM | POA: Insufficient documentation

## 2013-02-09 DIAGNOSIS — Z79899 Other long term (current) drug therapy: Secondary | ICD-10-CM | POA: Insufficient documentation

## 2013-02-09 DIAGNOSIS — R6883 Chills (without fever): Secondary | ICD-10-CM | POA: Insufficient documentation

## 2013-02-09 DIAGNOSIS — F17200 Nicotine dependence, unspecified, uncomplicated: Secondary | ICD-10-CM | POA: Insufficient documentation

## 2013-02-09 DIAGNOSIS — M549 Dorsalgia, unspecified: Secondary | ICD-10-CM | POA: Insufficient documentation

## 2013-02-09 DIAGNOSIS — I10 Essential (primary) hypertension: Secondary | ICD-10-CM | POA: Insufficient documentation

## 2013-02-09 DIAGNOSIS — R112 Nausea with vomiting, unspecified: Secondary | ICD-10-CM | POA: Insufficient documentation

## 2013-02-09 DIAGNOSIS — G8929 Other chronic pain: Secondary | ICD-10-CM | POA: Insufficient documentation

## 2013-02-09 LAB — URINE MICROSCOPIC-ADD ON

## 2013-02-09 LAB — URINALYSIS, ROUTINE W REFLEX MICROSCOPIC
Glucose, UA: NEGATIVE mg/dL
Leukocytes, UA: NEGATIVE
Protein, ur: 30 mg/dL — AB
Specific Gravity, Urine: >1.03 — ABNORMAL HIGH (ref 1.005–1.030)
pH: 6 (ref 5.0–8.0)

## 2013-02-09 MED ORDER — SODIUM CHLORIDE 0.9 % IV SOLN
1000.0000 mL | Freq: Once | INTRAVENOUS | Status: AC
  Administered 2013-02-09: 1000 mL via INTRAVENOUS

## 2013-02-09 MED ORDER — ONDANSETRON HCL 4 MG/2ML IJ SOLN
INTRAMUSCULAR | Status: AC
  Administered 2013-02-09: 4 mg
  Filled 2013-02-09: qty 2

## 2013-02-09 MED ORDER — ONDANSETRON HCL 4 MG PO TABS: 4.0000 mg | ORAL_TABLET | Freq: Four times a day (QID) | ORAL | Status: DC

## 2013-02-09 MED ORDER — MORPHINE SULFATE 4 MG/ML IJ SOLN
4.0000 mg | Freq: Once | INTRAMUSCULAR | Status: AC
  Administered 2013-02-09: 4 mg via INTRAVENOUS
  Filled 2013-02-09: qty 1

## 2013-02-09 MED ORDER — METOCLOPRAMIDE HCL 5 MG/ML IJ SOLN
10.0000 mg | Freq: Once | INTRAMUSCULAR | Status: AC
  Administered 2013-02-09: 10 mg via INTRAVENOUS
  Filled 2013-02-09: qty 2

## 2013-02-09 MED ORDER — DIPHENHYDRAMINE HCL 50 MG/ML IJ SOLN
25.0000 mg | Freq: Once | INTRAMUSCULAR | Status: AC
  Administered 2013-02-09: 25 mg via INTRAVENOUS
  Filled 2013-02-09: qty 1

## 2013-02-09 MED ORDER — SODIUM CHLORIDE 0.9 % IV SOLN
1000.0000 mL | INTRAVENOUS | Status: DC
  Administered 2013-02-09: 1000 mL via INTRAVENOUS

## 2013-02-09 MED ORDER — CYCLOBENZAPRINE HCL 10 MG PO TABS: 10.0000 mg | ORAL_TABLET | Freq: Three times a day (TID) | ORAL | Status: DC | PRN

## 2013-02-09 MED ORDER — ONDANSETRON HCL 4 MG/2ML IJ SOLN
4.0000 mg | Freq: Once | INTRAMUSCULAR | Status: AC
  Administered 2013-02-09: 4 mg via INTRAVENOUS
  Filled 2013-02-09: qty 2

## 2013-02-09 MED ORDER — LORAZEPAM 2 MG/ML IJ SOLN
1.0000 mg | Freq: Once | INTRAMUSCULAR | Status: AC
  Administered 2013-02-09: 1 mg via INTRAVENOUS
  Filled 2013-02-09: qty 1

## 2013-02-09 MED ORDER — TRAMADOL HCL 50 MG PO TABS: 100.0000 mg | ORAL_TABLET | Freq: Four times a day (QID) | ORAL | Status: DC | PRN

## 2013-02-09 NOTE — ED Notes (Signed)
Patient resting with eyes closed.  Family member remains at bedside.

## 2013-02-09 NOTE — ED Notes (Signed)
Pt alert & oriented x4, stable gait. Patient given discharge instructions, paperwork & prescription(s). Patient  instructed to stop at the registration desk to finish any additional paperwork. Patient verbalized understanding. Pt left department w/ no further questions. 

## 2013-02-09 NOTE — ED Provider Notes (Signed)
History     CSN: [id number]  Arrival date & time 02/09/13  0203   First MD Initiated Contact with Patient 02/09/13 0247      Chief Complaint  Patient presents with  . Abdominal Pain  . Back Pain  . Flank Pain    (Consider location/radiation/quality/duration/timing/severity/associated sxs/prior treatment) HPI  Patient reports she has had right-sided back pain for the past few days. She also reports she's been having frequency but is only dribbling. She states tonight while urinating she had acute onset of severe pain that is now in her left flank and radiates into her left side/upper abdomen. She is also started having nausea and vomiting. She states she's having chills. She states she's never had this pain before. She denies it feeling like her usual chronic back pain. However she cannot tell me what her usual chronic back pain is like. She does not know if there is any family history of kidney stones. Nothing she does makes it feel better, nothing she does makes it feel worse.  PCP [facility] Dr Concepcion Elk  Past Medical History  Diagnosis Date  . Chronic back pain   . Migraines   . Fibromyalgia   . Hypertension   . Asthma     Past Surgical History  Procedure Laterality Date  . Breast lumpectomy    . Tubal ligation    . Cryoablation      No family history on file.  History  Substance Use Topics  . Smoking status: Current Some Day Smoker    Types: Cigarettes  . Smokeless tobacco: Not on file  . Alcohol Use: No   unemployed  OB History   Grav Para Term Preterm Abortions TAB SAB Ect Mult Living                  Review of Systems  All other systems reviewed and are negative.    Allergies  Aspirin; Aspirin; Ceclor; and Ceclor  Home Medications   Current Outpatient Rx  Name  Route  Sig  Dispense  Refill  . albuterol (PROVENTIL HFA;VENTOLIN HFA) 108 (90 BASE) MCG/ACT inhaler   Inhalation   Inhale 2 puffs into the lungs every 6 (six) hours as  needed for wheezing.         Marland Kitchen albuterol (PROVENTIL) (2.5 MG/3ML) 0.083% nebulizer solution   Nebulization   Take 2.5 mg by nebulization every 6 (six) hours as needed for wheezing.         . DULoxetine (CYMBALTA) 60 MG capsule   Oral   Take 60 mg by mouth daily.         Marland Kitchen gabapentin (NEURONTIN) 600 MG tablet   Oral   Take 600 mg by mouth 2 (two) times daily.         Marland Kitchen lisinopril-hydrochlorothiazide (PRINZIDE,ZESTORETIC) 20-12.5 MG per tablet   Oral   Take 1 tablet by mouth daily.         . megestrol (MEGACE) 40 MG tablet   Oral   Take 40 mg by mouth daily.         . promethazine (PHENERGAN) 25 MG tablet   Oral   Take 1 tablet (25 mg total) by mouth every 6 (six) hours as needed for nausea.   15 tablet   0   . traMADol (ULTRAM) 50 MG tablet   Oral   Take 100 mg by mouth every 8 (eight) hours as needed for pain.         Marland Kitchen  diphenoxylate-atropine (LOMOTIL) 2.5-0.025 MG per tablet   Oral   Take 1 tablet by mouth 4 (four) times daily as needed for diarrhea or loose stools.   15 tablet   0   . FLUoxetine (PROZAC) 20 MG capsule   Oral   Take 20 mg by mouth daily.             BP 120/76  Pulse 104  Temp(Src) 98.1 F (36.7 C) (Oral)  Resp 24  Ht 5' 3.5" (1.613 m)  Wt 189 lb (85.73 kg)  BMI 32.95 kg/m2  SpO2 100%  Vital signs normal except tachycardia   Physical Exam  Nursing note and vitals reviewed. Constitutional: She is oriented to person, place, and time. She appears well-developed and well-nourished.  Non-toxic appearance. She does not appear ill. She appears distressed.  Actively gagging seems upset, stating "I can't do this anymore"  HENT:  Head: Normocephalic and atraumatic.  Right Ear: External ear normal.  Left Ear: External ear normal.  Nose: Nose normal. No mucosal edema or rhinorrhea.  Mouth/Throat: Oropharynx is clear and moist and mucous membranes are normal. No dental abscesses or edematous.  Eyes: Conjunctivae and EOM are  normal. Pupils are equal, round, and reactive to light.  Neck: Normal range of motion and full passive range of motion without pain. Neck supple.  Cardiovascular: Normal rate, regular rhythm and normal heart sounds.  Exam reveals no gallop and no friction rub.   No murmur heard. Pulmonary/Chest: Effort normal and breath sounds normal. No respiratory distress. She has no wheezes. She has no rhonchi. She has no rales. She exhibits no tenderness and no crepitus.  Abdominal: Soft. Normal appearance and bowel sounds are normal. She exhibits no distension. There is tenderness. There is no rebound and no guarding.    Musculoskeletal: Normal range of motion. She exhibits no edema and no tenderness.       Back:  Moves all extremities well.   Neurological: She is alert and oriented to person, place, and time. She has normal strength. No cranial nerve deficit.  Skin: Skin is warm, dry and intact. No rash noted. No erythema. No pallor.  Psychiatric: Her speech is normal and behavior is normal. Her mood appears not anxious.  Anxious    ED Course  Procedures (including critical care time)  Medications  0.9 %  sodium chloride infusion (0 mLs Intravenous Stopped 02/09/13 0442)    Followed by  0.9 %  sodium chloride infusion (1,000 mLs Intravenous New Bag/Given 02/09/13 0322)  ondansetron (ZOFRAN) 4 MG/2ML injection (4 mg  Given 02/09/13 0300)  ondansetron (ZOFRAN) injection 4 mg (4 mg Intravenous Given 02/09/13 0322)  morphine 4 MG/ML injection 4 mg (4 mg Intravenous Given 02/09/13 0322)  metoCLOPramide (REGLAN) injection 10 mg (10 mg Intravenous Given 02/09/13 0322)  diphenhydrAMINE (BENADRYL) injection 25 mg (25 mg Intravenous Given 02/09/13 0322)  LORazepam (ATIVAN) injection 1 mg (1 mg Intravenous Given 02/09/13 0322)   Review of prior studies shows she had a CT scan of her abdomen in July 2012 that was normal without renal stones.  Pt relates her pain is a lot better. Ready for discharge. Aware she  has another stone in her left kidney.   Results for orders placed during the hospital encounter of 02/09/13  URINALYSIS, ROUTINE W REFLEX MICROSCOPIC      Result Value Range   Color, Urine YELLOW  YELLOW   APPearance CLEAR  CLEAR   Specific Gravity, Urine >1.030 (*) 1.005 - 1.030  pH 6.0  5.0 - 8.0   Glucose, UA NEGATIVE  NEGATIVE mg/dL   Hgb urine dipstick MODERATE (*) NEGATIVE   Bilirubin Urine NEGATIVE  NEGATIVE   Ketones, ur TRACE (*) NEGATIVE mg/dL   Protein, ur 30 (*) NEGATIVE mg/dL   Urobilinogen, UA 1.0  0.0 - 1.0 mg/dL   Nitrite NEGATIVE  NEGATIVE   Leukocytes, UA NEGATIVE  NEGATIVE  URINE MICROSCOPIC-ADD ON      Result Value Range   Squamous Epithelial / LPF MANY (*) RARE   WBC, UA 0-2  <3 WBC/hpf   RBC / HPF 11-20  <3 RBC/hpf   Bacteria, UA MANY (*) RARE   Urine-Other MUCOUS PRESENT     Laboratory interpretation all normal except contaminated urine    Ct Abdomen Pelvis Wo Contrast  02/09/2013  *RADIOLOGY REPORT*  Clinical Data: Bilateral abdominal pain, left greater than right. Flank pain.  CT ABDOMEN AND PELVIS WITHOUT CONTRAST  Technique:  Multidetector CT imaging of the abdomen and pelvis was performed following the standard protocol without intravenous contrast.  Comparison: CT of the abdomen and pelvis performed 05/15/2011  Findings: Minimal right basilar atelectasis is noted.  Scattered small hepatic cysts are seen, measuring up to 1.9 cm in size.  The largest cyst has increased mildly in size from 2012. The liver is otherwise unremarkable in appearance.  The spleen is within normal limits.  The gallbladder is within normal limits. The pancreas and adrenal glands are unremarkable.  There is minimal left-sided renal pelvicaliectasis and prominence of the left ureter, without significant hydronephrosis.  Minimal soft tissue stranding is noted about the renal pelvis.  This could reflect a recently passed stone.  A 2 mm nonobstructing stone is noted near the lower pole of  the left kidney.  The right kidney is unremarkable in appearance.  No significant perinephric stranding is seen.  No free fluid is identified.  The small bowel is unremarkable in appearance.  The stomach is within normal limits.  No acute vascular abnormalities are seen.  The appendix is normal in caliber and contains air, without evidence for appendicitis.  The colon is unremarkable in appearance.  The bladder is mildly distended and grossly unremarkable in appearance.  The uterus is within normal limits.  The ovaries are relatively symmetric; no suspicious adnexal masses are seen.  No inguinal lymphadenopathy is seen.  No acute osseous abnormalities are identified.  IMPRESSION:  1.  Minimal left-sided renal pelvicaliectasis and prominence of the left ureter, without significant hydronephrosis.  Slight soft tissue stranding about the renal pelvis.  This could reflect a recently passed stone. 2.  2 mm nonobstructing stone noted near the lower pole of the left kidney. 3.  Scattered small hepatic cysts have increased mildly in size from 2012.   Original Report Authenticated By: Tonia Ghent, M.D.      1. Ureteral stone     New Prescriptions   CYCLOBENZAPRINE (FLEXERIL) 10 MG TABLET    Take 1 tablet (10 mg total) by mouth 3 (three) times daily as needed (pain in muscles).   ONDANSETRON (ZOFRAN) 4 MG TABLET    Take 1 tablet (4 mg total) by mouth every 6 (six) hours.   TRAMADOL (ULTRAM) 50 MG TABLET    Take 2 tablets (100 mg total) by mouth every 6 (six) hours as needed for pain.    Plan discharge  Devoria Albe, MD, FACEP  MDM          Ward Givens, MD 02/09/13 [phone]

## 2013-02-09 NOTE — ED Notes (Signed)
Patient c/o back pain, abdominal pain and urinary frequency without voiding a lot. States has some nausea, but has not vomited.

## 2013-04-24 ENCOUNTER — Other Ambulatory Visit (HOSPITAL_COMMUNITY): Payer: Self-pay | Admitting: General Practice

## 2013-04-24 DIAGNOSIS — R1031 Right lower quadrant pain: Secondary | ICD-10-CM

## 2013-04-27 ENCOUNTER — Ambulatory Visit (HOSPITAL_COMMUNITY): Payer: Medicaid Other

## 2013-04-28 ENCOUNTER — Ambulatory Visit (HOSPITAL_COMMUNITY)
Admission: RE | Admit: 2013-04-28 | Discharge: 2013-04-28 | Disposition: A | Discharge status: Home / Self Care | Payer: Medicaid Other | Source: Ambulatory Visit | Attending: General Practice | Admitting: General Practice

## 2013-05-03 ENCOUNTER — Ambulatory Visit (HOSPITAL_COMMUNITY)
Admission: RE | Admit: 2013-05-03 | Discharge: 2013-05-03 | Disposition: A | Discharge status: Home / Self Care | Payer: Medicaid Other | Source: Ambulatory Visit | Attending: General Practice | Admitting: General Practice

## 2013-05-03 DIAGNOSIS — K7689 Other specified diseases of liver: Secondary | ICD-10-CM | POA: Insufficient documentation

## 2013-05-03 DIAGNOSIS — N200 Calculus of kidney: Secondary | ICD-10-CM | POA: Insufficient documentation

## 2013-05-03 DIAGNOSIS — R1031 Right lower quadrant pain: Secondary | ICD-10-CM

## 2013-05-03 DIAGNOSIS — N949 Unspecified condition associated with female genital organs and menstrual cycle: Secondary | ICD-10-CM | POA: Insufficient documentation

## 2013-08-28 ENCOUNTER — Ambulatory Visit: Payer: Self-pay | Admitting: Obstetrics and Gynecology

## 2013-08-28 LAB — COMPREHENSIVE METABOLIC PANEL
Albumin: 3.2 g/dL — ABNORMAL LOW (ref 3.4–5.0)
Alkaline Phosphatase: 152 U/L — ABNORMAL HIGH (ref 50–136)
EGFR (African American): >60
EGFR (Non-African Amer.): >60
Glucose: 118 mg/dL — ABNORMAL HIGH (ref 65–99)
Osmolality: 273 (ref 275–301)
SGOT(AST): 16 U/L (ref 15–37)
SGPT (ALT): 17 U/L (ref 12–78)
Sodium: 137 mmol/L (ref 136–145)
Total Protein: 7.7 g/dL (ref 6.4–8.2)

## 2013-08-28 LAB — PREGNANCY, URINE: Pregnancy Test, Urine: NEGATIVE m[IU]/mL

## 2013-08-28 LAB — CBC
HCT: 41.8 % (ref 35.0–47.0)
MCHC: 31.9 g/dL — ABNORMAL LOW (ref 32.0–36.0)
RDW: 15.8 % — ABNORMAL HIGH (ref 11.5–14.5)
WBC: 18.7 10*3/uL — ABNORMAL HIGH (ref 3.6–11.0)

## 2013-09-08 ENCOUNTER — Ambulatory Visit: Payer: Self-pay | Admitting: Obstetrics and Gynecology

## 2014-03-22 ENCOUNTER — Encounter (HOSPITAL_COMMUNITY): Payer: Self-pay | Admitting: Emergency Medicine

## 2014-03-22 ENCOUNTER — Emergency Department (HOSPITAL_COMMUNITY): Payer: Medicaid Other

## 2014-03-22 ENCOUNTER — Emergency Department (HOSPITAL_COMMUNITY)
Admission: EM | Admit: 2014-03-22 | Discharge: 2014-03-22 | Disposition: A | Discharge status: Home / Self Care | Payer: Medicaid Other | Attending: Emergency Medicine | Admitting: Emergency Medicine

## 2014-03-22 DIAGNOSIS — F17200 Nicotine dependence, unspecified, uncomplicated: Secondary | ICD-10-CM | POA: Insufficient documentation

## 2014-03-22 DIAGNOSIS — Z3202 Encounter for pregnancy test, result negative: Secondary | ICD-10-CM | POA: Insufficient documentation

## 2014-03-22 DIAGNOSIS — G8929 Other chronic pain: Secondary | ICD-10-CM | POA: Insufficient documentation

## 2014-03-22 DIAGNOSIS — J45909 Unspecified asthma, uncomplicated: Secondary | ICD-10-CM | POA: Insufficient documentation

## 2014-03-22 DIAGNOSIS — Z79899 Other long term (current) drug therapy: Secondary | ICD-10-CM | POA: Insufficient documentation

## 2014-03-22 DIAGNOSIS — IMO0001 Reserved for inherently not codable concepts without codable children: Secondary | ICD-10-CM | POA: Insufficient documentation

## 2014-03-22 DIAGNOSIS — R197 Diarrhea, unspecified: Secondary | ICD-10-CM | POA: Insufficient documentation

## 2014-03-22 DIAGNOSIS — D72829 Elevated white blood cell count, unspecified: Secondary | ICD-10-CM | POA: Insufficient documentation

## 2014-03-22 DIAGNOSIS — R112 Nausea with vomiting, unspecified: Secondary | ICD-10-CM | POA: Insufficient documentation

## 2014-03-22 DIAGNOSIS — G43909 Migraine, unspecified, not intractable, without status migrainosus: Secondary | ICD-10-CM | POA: Insufficient documentation

## 2014-03-22 DIAGNOSIS — I10 Essential (primary) hypertension: Secondary | ICD-10-CM | POA: Insufficient documentation

## 2014-03-22 DIAGNOSIS — R109 Unspecified abdominal pain: Secondary | ICD-10-CM | POA: Insufficient documentation

## 2014-03-22 LAB — CBC WITH DIFFERENTIAL/PLATELET
Basophils Absolute: 0 10*3/uL (ref 0.0–0.1)
Basophils Relative: 0 % (ref 0–1)
EOS PCT: 0 % (ref 0–5)
Eosinophils Absolute: 0 10*3/uL (ref 0.0–0.7)
HCT: 41.9 % (ref 36.0–46.0)
Hemoglobin: 13.7 g/dL (ref 12.0–15.0)
LYMPHS ABS: 0.8 10*3/uL (ref 0.7–4.0)
LYMPHS PCT: 4 % — AB (ref 12–46)
MCH: 22.4 pg — ABNORMAL LOW (ref 26.0–34.0)
MCHC: 32.7 g/dL (ref 30.0–36.0)
MCV: 68.6 fL — AB (ref 78.0–100.0)
Monocytes Absolute: 1.1 10*3/uL — ABNORMAL HIGH (ref 0.1–1.0)
Monocytes Relative: 5 % (ref 3–12)
NEUTROS ABS: 19.5 10*3/uL — AB (ref 1.7–7.7)
Neutrophils Relative %: 91 % — ABNORMAL HIGH (ref 43–77)
PLATELETS: 387 10*3/uL (ref 150–400)
RBC: 6.11 MIL/uL — AB (ref 3.87–5.11)
RDW: 18.5 % — ABNORMAL HIGH (ref 11.5–15.5)
WBC: 21.5 10*3/uL — AB (ref 4.0–10.5)

## 2014-03-22 LAB — URINALYSIS, ROUTINE W REFLEX MICROSCOPIC
BILIRUBIN URINE: NEGATIVE
Glucose, UA: NEGATIVE mg/dL
KETONES UR: NEGATIVE mg/dL
Leukocytes, UA: NEGATIVE
NITRITE: NEGATIVE
PROTEIN: 30 mg/dL — AB
Specific Gravity, Urine: 1.025 (ref 1.005–1.030)
UROBILINOGEN UA: 0.2 mg/dL (ref 0.0–1.0)
pH: 6 (ref 5.0–8.0)

## 2014-03-22 LAB — COMPREHENSIVE METABOLIC PANEL
ALK PHOS: 128 U/L — AB (ref 39–117)
ALT: 10 U/L (ref 0–35)
AST: 11 U/L (ref 0–37)
Albumin: 3.5 g/dL (ref 3.5–5.2)
BILIRUBIN TOTAL: 0.4 mg/dL (ref 0.3–1.2)
BUN: 7 mg/dL (ref 6–23)
CALCIUM: 8.9 mg/dL (ref 8.4–10.5)
CHLORIDE: 102 meq/L (ref 96–112)
CO2: 25 meq/L (ref 19–32)
Creatinine, Ser: 0.69 mg/dL (ref 0.50–1.10)
GLUCOSE: 131 mg/dL — AB (ref 70–99)
POTASSIUM: 3.6 meq/L — AB (ref 3.7–5.3)
SODIUM: 141 meq/L (ref 137–147)
Total Protein: 7.8 g/dL (ref 6.0–8.3)

## 2014-03-22 LAB — URINE MICROSCOPIC-ADD ON

## 2014-03-22 LAB — POC URINE PREG, ED: Preg Test, Ur: NEGATIVE

## 2014-03-22 MED ORDER — DROPERIDOL 2.5 MG/ML IJ SOLN: 1.2500 mg | Freq: Once | INTRAMUSCULAR | Status: DC

## 2014-03-22 MED ORDER — PROMETHAZINE HCL 25 MG RE SUPP: 25.0000 mg | Freq: Four times a day (QID) | RECTAL | Status: AC | PRN

## 2014-03-22 MED ORDER — DIPHENHYDRAMINE HCL 50 MG/ML IJ SOLN
25.0000 mg | Freq: Once | INTRAMUSCULAR | Status: AC
  Administered 2014-03-22: 25 mg via INTRAVENOUS
  Filled 2014-03-22: qty 1

## 2014-03-22 MED ORDER — SODIUM CHLORIDE 0.9 % IV BOLUS (SEPSIS)
1000.0000 mL | Freq: Once | INTRAVENOUS | Status: AC
  Administered 2014-03-22: 1000 mL via INTRAVENOUS

## 2014-03-22 MED ORDER — SODIUM CHLORIDE 0.9 % IV BOLUS (SEPSIS): 1000.0000 mL | Freq: Once | INTRAVENOUS | Status: DC

## 2014-03-22 MED ORDER — METOCLOPRAMIDE HCL 5 MG/ML IJ SOLN
10.0000 mg | Freq: Once | INTRAMUSCULAR | Status: AC
  Administered 2014-03-22: 10 mg via INTRAVENOUS
  Filled 2014-03-22: qty 2

## 2014-03-22 NOTE — Care Management Note (Signed)
Patient was noted to not have a PCP listed, but per patient PCP is [facility]  . Entered this information into computer. 

## 2014-03-22 NOTE — ED Notes (Signed)
EMS administered 4mg  zofran prior to arrival.

## 2014-03-22 NOTE — ED Notes (Signed)
Pt c/o generalized body aches and abdominal pain as well as n/v/d since 2200 last night. Pt denies blood in vomit or stool. Pt denies fever.

## 2014-03-22 NOTE — ED Provider Notes (Signed)
CSN: [id number]     Arrival date & time 03/22/14  [id number] History  This chart was scribed for Gerhard Munchobert Reeanna Acri, MD by Leone PayorSonum Patel, ED Scribe. This patient was seen in room APA11/APA11 and the patient's care was started 8:04 AM.    Chief Complaint  Patient presents with  . Emesis  . Diarrhea     The history is provided by the patient. No language interpreter was used.   HPI Comments: Melissa Lindsey is a 38 y.o. female who presents to the Emergency Department complaining of sudden onset, constant abdominal pain with associated episodes of intermittent nausea, vomiting, and diarrhea that all began last night. Patient reports similar symptoms in the past but states this current episode is more severe. She denies fever, hematochezia, hematemesis.    Past Medical History  Diagnosis Date  . Chronic back pain   . Migraines   . Fibromyalgia   . Hypertension   . Asthma    Past Surgical History  Procedure Laterality Date  . Breast lumpectomy    . Tubal ligation    . Cryoablation     No family history on file. History  Substance Use Topics  . Smoking status: Current Some Day Smoker    Types: Cigarettes  . Smokeless tobacco: Not on file  . Alcohol Use: No   OB History   Grav Para Term Preterm Abortions TAB SAB Ect Mult Living                 Review of Systems  Constitutional:       Per HPI, otherwise negative  HENT:       Per HPI, otherwise negative  Respiratory:       Per HPI, otherwise negative  Cardiovascular:       Per HPI, otherwise negative  Gastrointestinal: Positive for nausea, vomiting, abdominal pain and diarrhea. Negative for blood in stool and anal bleeding.  Endocrine:       Negative aside from HPI  Genitourinary:       Neg aside from HPI   Musculoskeletal:       Per HPI, otherwise negative  Skin: Negative.   Neurological: Negative for syncope.      Allergies  Aspirin; Aspirin; Ceclor; and Ceclor  Home Medications   Prior to Admission medications    Medication Sig Start Date End Date Taking? Authorizing Provider  albuterol (PROVENTIL HFA;VENTOLIN HFA) 108 (90 BASE) MCG/ACT inhaler Inhale 2 puffs into the lungs every 6 (six) hours as needed for wheezing.    Historical Provider, MD  albuterol (PROVENTIL) (2.5 MG/3ML) 0.083% nebulizer solution Take 2.5 mg by nebulization every 6 (six) hours as needed for wheezing.    Historical Provider, MD  cyclobenzaprine (FLEXERIL) 10 MG tablet Take 1 tablet (10 mg total) by mouth 3 (three) times daily as needed (pain in muscles). 02/09/13   Ward GivensIva L Knapp, MD  diphenoxylate-atropine (LOMOTIL) 2.5-0.025 MG per tablet Take 1 tablet by mouth 4 (four) times daily as needed for diarrhea or loose stools. 12/27/12   Donnetta HutchingBrian Cook, MD  DULoxetine (CYMBALTA) 60 MG capsule Take 60 mg by mouth daily.    Historical Provider, MD  FLUoxetine (PROZAC) 20 MG capsule Take 20 mg by mouth daily.      Historical Provider, MD  gabapentin (NEURONTIN) 600 MG tablet Take 600 mg by mouth 2 (two) times daily.    Historical Provider, MD  lisinopril-hydrochlorothiazide (PRINZIDE,ZESTORETIC) 20-12.5 MG per tablet Take 1 tablet by mouth daily.    Historical Provider, MD  megestrol (MEGACE) 40 MG tablet Take 40 mg by mouth daily.    Historical Provider, MD  ondansetron (ZOFRAN) 4 MG tablet Take 1 tablet (4 mg total) by mouth every 6 (six) hours. 02/09/13   Ward GivensIva L Knapp, MD  promethazine (PHENERGAN) 25 MG tablet Take 1 tablet (25 mg total) by mouth every 6 (six) hours as needed for nausea. 12/27/12   Donnetta HutchingBrian Cook, MD  traMADol (ULTRAM) 50 MG tablet Take 100 mg by mouth every 8 (eight) hours as needed for pain.    Historical Provider, MD  traMADol (ULTRAM) 50 MG tablet Take 2 tablets (100 mg total) by mouth every 6 (six) hours as needed for pain. 02/09/13   Ward GivensIva L Knapp, MD   BP 148/77  Pulse 96  Temp(Src) 98.4 F (36.9 C) (Oral)  Resp 24  SpO2 99%  LMP 03/15/2014 Physical Exam  Nursing note and vitals reviewed. Constitutional: She is oriented to  person, place, and time. She appears well-developed and well-nourished. No distress.  HENT:  Head: Normocephalic and atraumatic.  Eyes: Conjunctivae and EOM are normal.  Cardiovascular: Normal rate and regular rhythm.   Pulmonary/Chest: Effort normal and breath sounds normal. No stridor. No respiratory distress.  Abdominal: She exhibits no distension.  Minimal TTP about the abdomen midline superior and inferior.  Musculoskeletal: She exhibits no edema.  Neurological: She is alert and oriented to person, place, and time. No cranial nerve deficit.  Skin: Skin is warm and dry.  Psychiatric: She has a normal mood and affect.    ED Course  Procedures (including critical care time)  DIAGNOSTIC STUDIES: Oxygen Saturation is 99% on RA, normal by my interpretation.    COORDINATION OF CARE: 8:17 AM Discussed treatment plan with pt at bedside and pt agreed to plan.   Labs Review Labs Reviewed  CBC WITH DIFFERENTIAL - Abnormal; Notable for the following:    WBC 21.5 (*)    RBC 6.11 (*)    MCV 68.6 (*)    MCH 22.4 (*)    RDW 18.5 (*)    Neutrophils Relative % 91 (*)    Neutro Abs 19.5 (*)    Lymphocytes Relative 4 (*)    Monocytes Absolute 1.1 (*)    All other components within normal limits  COMPREHENSIVE METABOLIC PANEL - Abnormal; Notable for the following:    Potassium 3.6 (*)    Glucose, Bld 131 (*)    Alkaline Phosphatase 128 (*)    All other components within normal limits  URINALYSIS, ROUTINE W REFLEX MICROSCOPIC - Abnormal; Notable for the following:    Hgb urine dipstick MODERATE (*)    Protein, ur 30 (*)    All other components within normal limits  URINE MICROSCOPIC-ADD ON - Abnormal; Notable for the following:    Squamous Epithelial / LPF FEW (*)    Bacteria, UA MANY (*)    All other components within normal limits  POC URINE PREG, ED    Imaging Review Dg Abd Acute W/chest  03/22/2014   CLINICAL DATA:  Upper abdominal pain with nausea, vomiting common diarrhea.  Diffuse body aches.  EXAM: ACUTE ABDOMEN SERIES (ABDOMEN 2 VIEW & CHEST 1 VIEW)  COMPARISON:  Chest x-ray dated 06/16/2009 and abdominal CT scan dated 02/09/2013  FINDINGS: Heart and lungs appear normal. No free air or free fluid in the abdomen. The bowel gas pattern is normal. Single phlebolith in the right side of the pelvis, unchanged. No significant osseous abnormality.  IMPRESSION: Benign appearing abdomen and chest.  Electronically Signed   By: Geanie Cooley M.D.   On: 03/22/2014 09:22    8:56 AM Patient appears substantially more comfortable. Initial labs notable for leukocytosis, the patient has had leukocytosis of times in the past.  9:43 AM Patient sitting upright, asking for water.    Update: No distress.  MDM    I personally performed the services described in this documentation, which was scribed in my presence. The recorded information has been reviewed and is accurate.   Patient presents with nausea, vomiting, diarrhea. Patient is awake and alert, afebrile, hemodynamically stable, with a soft abdomen.  Patient's labs are notable for leukocytosis, but patient improved substantially her with fluids, analgesics.  The patient was tolerant of oral intake him a in no distress, appropriate for discharge with further evaluation, management as an outpatient.  Gerhard Munch, MD 03/22/14 [phone]

## 2014-03-22 NOTE — Discharge Instructions (Signed)
As discussed, it is very important that you call your physician today to arrange appropriate ongoing care.  Please drink plenty of fluids, rest, take all medication as directed, and to do not hesitate to return here if you develop new, or concerning changes in your condition.  Please discuss your leukocytosis (elevated white blood cell count) with your physician.

## 2014-03-22 NOTE — ED Notes (Signed)
Pt c/o n/v/d since last night

## 2014-05-24 ENCOUNTER — Ambulatory Visit: Payer: Self-pay | Admitting: Obstetrics and Gynecology

## 2014-05-24 LAB — CBC
HCT: 42.1 % (ref 35.0–47.0)
HGB: 13.1 g/dL (ref 12.0–16.0)
MCH: 22.3 pg — ABNORMAL LOW (ref 26.0–34.0)
MCHC: 31.1 g/dL — ABNORMAL LOW (ref 32.0–36.0)
MCV: 72 fL — ABNORMAL LOW (ref 80–100)
Platelet: 353 10*3/uL (ref 150–440)
RBC: 5.86 10*6/uL — ABNORMAL HIGH (ref 3.80–5.20)
RDW: 16.6 % — AB (ref 11.5–14.5)
WBC: 17.9 10*3/uL — AB (ref 3.6–11.0)

## 2014-05-24 LAB — COMPREHENSIVE METABOLIC PANEL
ALBUMIN: 3.2 g/dL — AB (ref 3.4–5.0)
AST: 14 U/L — AB (ref 15–37)
Alkaline Phosphatase: 114 U/L
Anion Gap: 7 (ref 7–16)
BILIRUBIN TOTAL: 0.3 mg/dL (ref 0.2–1.0)
BUN: 8 mg/dL (ref 7–18)
Calcium, Total: 8.4 mg/dL — ABNORMAL LOW (ref 8.5–10.1)
Chloride: 103 mmol/L (ref 98–107)
Co2: 29 mmol/L (ref 21–32)
Creatinine: 0.74 mg/dL (ref 0.60–1.30)
EGFR (African American): >60
Glucose: 94 mg/dL (ref 65–99)
OSMOLALITY: 276 (ref 275–301)
POTASSIUM: 3.6 mmol/L (ref 3.5–5.1)
SGPT (ALT): 19 U/L
Sodium: 139 mmol/L (ref 136–145)
Total Protein: 7.4 g/dL (ref 6.4–8.2)

## 2014-05-31 ENCOUNTER — Ambulatory Visit: Payer: Self-pay | Admitting: Obstetrics and Gynecology

## 2014-05-31 LAB — CBC
HCT: 36.6 % (ref 35.0–47.0)
HGB: 11.5 g/dL — AB (ref 12.0–16.0)
MCH: 22.4 pg — AB (ref 26.0–34.0)
MCHC: 31.5 g/dL — ABNORMAL LOW (ref 32.0–36.0)
MCV: 71 fL — AB (ref 80–100)
Platelet: 298 10*3/uL (ref 150–440)
RBC: 5.15 10*6/uL (ref 3.80–5.20)
RDW: 16.3 % — ABNORMAL HIGH (ref 11.5–14.5)
WBC: 17.1 10*3/uL — ABNORMAL HIGH (ref 3.6–11.0)

## 2014-05-31 LAB — BASIC METABOLIC PANEL
Anion Gap: 6 — ABNORMAL LOW (ref 7–16)
BUN: 5 mg/dL — ABNORMAL LOW (ref 7–18)
CALCIUM: 7.8 mg/dL — AB (ref 8.5–10.1)
CREATININE: 0.81 mg/dL (ref 0.60–1.30)
Chloride: 107 mmol/L (ref 98–107)
Co2: 28 mmol/L (ref 21–32)
EGFR (African American): >60
GLUCOSE: 114 mg/dL — AB (ref 65–99)
Osmolality: 279 (ref 275–301)
Potassium: 3.3 mmol/L — ABNORMAL LOW (ref 3.5–5.1)
Sodium: 141 mmol/L (ref 136–145)

## 2014-06-01 LAB — CBC
HCT: 35.5 % (ref 35.0–47.0)
HGB: 11.2 g/dL — ABNORMAL LOW (ref 12.0–16.0)
MCH: 22.7 pg — ABNORMAL LOW (ref 26.0–34.0)
MCHC: 31.5 g/dL — ABNORMAL LOW (ref 32.0–36.0)
MCV: 72 fL — AB (ref 80–100)
PLATELETS: 297 10*3/uL (ref 150–440)
RBC: 4.94 10*6/uL (ref 3.80–5.20)
RDW: 16.4 % — ABNORMAL HIGH (ref 11.5–14.5)
WBC: 17.1 10*3/uL — ABNORMAL HIGH (ref 3.6–11.0)

## 2014-06-01 LAB — BASIC METABOLIC PANEL
ANION GAP: 6 — AB (ref 7–16)
BUN: 3 mg/dL — ABNORMAL LOW (ref 7–18)
CHLORIDE: 109 mmol/L — AB (ref 98–107)
Calcium, Total: 7.8 mg/dL — ABNORMAL LOW (ref 8.5–10.1)
Co2: 28 mmol/L (ref 21–32)
Creatinine: 0.8 mg/dL (ref 0.60–1.30)
EGFR (African American): >60
Glucose: 95 mg/dL (ref 65–99)
Osmolality: 281 (ref 275–301)
Potassium: 3.3 mmol/L — ABNORMAL LOW (ref 3.5–5.1)
Sodium: 143 mmol/L (ref 136–145)

## 2014-06-01 LAB — PATHOLOGY REPORT

## 2014-08-14 ENCOUNTER — Ambulatory Visit: Payer: Self-pay | Admitting: Obstetrics and Gynecology

## 2015-02-22 NOTE — Op Note (Signed)
PATIENT NAME:  Royann ShiversSCOTT, Rajni MR#:  [id number] DATE OF BIRTH:  11-24-1975  DATE OF PROCEDURE:  09/08/2013  PREOPERATIVE DIAGNOSES: 1.  Abnormal uterine bleeding with no obvious etiology.  2.  Chronic pelvic pain.   POSTOPERATIVE DIAGNOSES:  1.  Abnormal uterine bleeding with no obvious etiology.  2.  Chronic pelvic pain.   PROCEDURES: 1.  Diagnostic laparoscopy. 2.  Hysteroscopy, dilation and curettage.   SURGEON: Thomasene MohairStephen Pheonix Clinkscale, MD   ASSISTANT: Vena AustriaAndreas Staebler, MD  ANESTHESIA: General.   ESTIMATED BLOOD LOSS: Minimal.   OPERATIVE FLUIDS: 900 mL.   URINE OUTPUT: 200 mL clear urine.   SPECIMENS: Endometrial curettings.   COMPLICATIONS: None.   FINDINGS: 1.  Normal appearing appendix and liver.  2.  No obvious pelvic etiology to pain with no evidence of pelvic congestion or endometriosis implants.  3.  Normal appearing endometrial cavity.  CONDITION AT END OF PROCEDURE: Stable.   PROCEDURE IN DETAIL: The patient was taken to the operating room where general anesthesia was administered and found to be adequate. She was positioned in the dorsal supine lithotomy position and prepped and draped in the usual sterile fashion. After a timeout was called, a Foley catheter was placed in her bladder and a sponge stick was placed into the vagina for uterine manipulation.   Attention was turned to the abdomen where after local anesthesia was injected a 5 mm infraumbilical incision was made with a scalpel and using direct visualization an Optiview trocar entrance was gained to the abdomen using trocar without incident. Entry into the abdomen was verified using opening pressures. After insufflation of the abdomen with CO2, the camera was reintroduced and atraumatic entry was verified. A right lower quadrant 5 mm port was placed under direct intra-abdominal visualization after administration of local anesthetic at the site of port placement. Survey of the abdomen and pelvis was undertaken  with the above-noted findings. At this point, the abdominal and pelvic portion of the procedure was terminated. The abdomen was desufflated of CO2 and all instrumentation was removed. The skin was closed using Dermabond.   Attention was turned to the hysteroscopy portion of the procedure where the sponge stick was removed from the vagina, a sterile speculum was placed in the vagina, and the anterior lip of the cervix was grasped with a single-tooth tenaculum. The uterus was sounded to a depth of 7 cm. The cervix was gently dilated in a serial fashion using Hegar dilators to dilatation of 8 mm. The hysteroscope was gently introduced through the cervix into the uterus with the above-noted findings. The hysteroscope was removed and gentle curettage was performed to get a thorough sampling of the entire uterine cavity. Specimen was collected. The hysteroscope was reintroduced to verify no injury occurred. The hysteroscope was removed and all instrumentation was removed from the cervix and hemostasis was noted. The single-tooth tenaculum and speculum were removed from the vagina and no other instrumentation was found in the vagina. The Foley catheter was removed as well.   The patient tolerated the procedure well. Sponge, lap and needle counts were correct x 2. The patient was using pneumatic compression stockings that were in place and operating throughout the entire procedure for VTE prophylaxis. She was awakened in the operating room and taken to the recovery area in stable condition.  ____________________________ Conard NovakStephen D. Koren Plyler, MD sdj:sb D: 09/08/2013 10:21:44 ET T: 09/08/2013 10:39:55 ET JOB#: [id number]  cc: Conard NovakStephen D. Magdeline Prange, MD, <Dictator> Conard NovakSTEPHEN D Myana Schlup MD ELECTRONICALLY SIGNED 09/17/2013 11:56

## 2015-02-23 NOTE — Op Note (Signed)
PATIENT NAME:  Melissa Lindsey, Melissa Lindsey MR#:  [id number] DATE OF BIRTH:  Jan 28, 1976  DATE OF PROCEDURE:  05/31/2014  PREOPERATIVE DIAGNOSES: 1.  Chronic pelvic pain.  2.  Abnormal uterine bleeding, etiology unknown.   POSTOPERATIVE DIAGNOSES: 1.  Chronic pelvic pain.  2.  Abnormal uterine bleeding, etiology unknown.   PROCEDURE:  1.  Total laparoscopic hysterectomy.  2.  Bilateral salpingectomy.  3.  Cystoscopy.   SURGEON: Thomasene MohairStephen Klani Caridi, M.D.   ASSISTANT:   Elliot Gurneyarrie C. Klett, MD.  ESTIMATED BLOOD LOSS: 50 mL.  OPERATIVE FLUIDS: 900 mL crystalloid.   COMPLICATIONS: None.   FINDINGS: 1.  Mildly enlarged uterus with normal-appearing ovaries and fallopian tubes that are status post bilateral tubal ligation.  2.  No obvious defects to bladder wall bilateral efflux of urine from the ureteral orifices on  cystoscopy.   SPECIMENS: 1.  Uterus with cervix.  2.  Bilateral fallopian tubes.   CONDITION AT END OF PROCEDURE: Stable.   PROCEDURE IN DETAIL: The patient was taken to the operating room where general anesthesia was administered and found to be adequate. The patient was placed in the dorsal supine lithotomy position in [place] stirrups and prepped and draped in the usual sterile fashion. Care was taken to position the patient such as to minimize any potential damage to nerves.   After a timeout was called, indwelling Foley catheter was placed through the urethra and a sterile speculum was placed in the vagina and a single-tooth tenaculum was used to grasp the anterior lip of the cervix. The uterus was sounded to a depth of approximately 8 cm. A large V-Care device was placed according to the manufacturer's recommendations after insufflation of the V-Care intrauterine balloon, the tenaculum was removed and the KOH ring of the V-Care device was affixed to the vaginal fornices and this was held in place by the locking mechanism on the device. A vaginal occluder balloon was placed over the V-Care  device prior to placement of the V-Care device. At this point, all instruments apart from the V-Care device were removed from the vagina. The vaginal occluder balloon was in the deflating position until the colpotomy.  Attention was turned to the abdomen, where after injection of local anesthetic, a 5 mm infraumbilical incision was made with a scalpel and the abdomen was entered using direct visualization technique through a trocar with entrance into the abdomen verified using opening pressures. After insufflation of the abdomen with CO2, the camera was reintroduced through the port site with verification of atraumatic entry. The patient was placed in Trendelenburg position and a right lower quadrant 11 mm port was accomplished via direct intra-abdominal camera visualization without difficulty and a 5 mm left lower quadrant port was placed in a similar fashion without difficulty.   Attention was turned to the pelvic brim where visualization of both ureters was undertaken with, both ureters noted to be peristalsing well and both were located well away from the operative area of interest. Right fallopian tube was grasped at the fimbriated end and the mesosalpinx was transected from a lateral to medial fashion, but not completely removed. The utero-ovarian ligament was then cauterized and transected using the Harmonic scalpel to the level of the round ligament. The round ligament was cauterized and transected and the anterior leaf of the broad ligament was transected down to the level of the internal os. A bladder flap was, at this point, created without difficulty. The posterior leaf of the broad ligament was then transected in a similar fashion  to the level of the anterior os of the cervix.   The uterine artery was skeletonized and cauterized and transected with the Harmonic scalpel at the level of internal cervical os. The same procedure was carried out on the left side in the same fashion without difficulty.  At this point, with the bladder well away from the [facility] ring, pressure was applied to the V-Care device at the vagina, the vaginal occluder balloon was inflated, and uterus was elevated away from the ureters, and using the Harmonic scalpel, colpotomy was performed in the circumferential fashion following the KOH ring and the uterus and cervix were liberated from the vagina. The uterus, fallopian tubes and cervix were removed through the vagina.   The vaginal cuff was then closed using an Endo Stitch with a V-Loc suture in a running fashion without difficulty. Gloved hand was then placed in the vagina with pressure applied to the vaginal cuff to ensure effective closure of the vaginal cuff. After this was verified, the pelvis was copiously irrigated and hemostasis was verified. At this point, pressure was reduced to assure  continued hemostasis with lower intraabdominal pressure, which was verified. The abdomen was then desufflated of CO2 and all port sites were removed without difficulty. Skin incisions were closed using 4-0 Monocryl in a subcuticular fashion, and reapproximated using a surgical skin glue. A total of 10 mL of 0.5% Sensorcaine plain was injected at the end of the procedure at each incision site.   Cystoscopy was undertaken using a 70 degrees cystoscope. The bladder was distended with approximately 200 mL of normal saline with the above-noted findings. Efflux of urine was noted from each of the ureteral orifices and no defects were noted in the bladder wall. The cystoscope was then removed after drainage of the bladder and the Foley catheter was replaced. A sweep of the vagina was undertaken to verify no instrumentation or sponges were remaining.   The patient tolerated the procedure well. Sponge, lap, and needle counts were correct x2. For antibiotic prophylaxis, the patient received gentamicin and clindamycin. For VTE prophylaxis, she was wearing pneumatic compression stockings throughout the  entire procedure. She was awakened in the operating room and taken to the recovery area in stable condition.    ____________________________ Conard Novak, MD sdj:ds D: 05/31/2014 21:03:18 ET T: 05/31/2014 21:51:44 ET JOB#: [id number]  cc: Conard Novak, MD, <Dictator> Conard Novak MD ELECTRONICALLY SIGNED 07/15/2014 14:21
